# Patient Record
Sex: Male | Born: 1960 | Race: Black or African American | Hispanic: No | Marital: Married | Smoking: Never smoker
Health system: Southern US, Community
[De-identification: ages and names within clinical notes are randomized; demographics above are authoritative.]

## PROBLEM LIST (undated history)

## (undated) DIAGNOSIS — I10 Essential (primary) hypertension: Secondary | ICD-10-CM

## (undated) DIAGNOSIS — K219 Gastro-esophageal reflux disease without esophagitis: Secondary | ICD-10-CM

## (undated) DIAGNOSIS — T884XXA Failed or difficult intubation, initial encounter: Secondary | ICD-10-CM

## (undated) DIAGNOSIS — M1990 Unspecified osteoarthritis, unspecified site: Secondary | ICD-10-CM

## (undated) DIAGNOSIS — T8859XA Other complications of anesthesia, initial encounter: Secondary | ICD-10-CM

## (undated) DIAGNOSIS — T4145XA Adverse effect of unspecified anesthetic, initial encounter: Secondary | ICD-10-CM

## (undated) HISTORY — PX: WRIST SURGERY: SHX841

## (undated) HISTORY — PX: VASECTOMY: SHX75

---

## 2007-06-19 ENCOUNTER — Ambulatory Visit (HOSPITAL_COMMUNITY): Admission: RE | Admit: 2007-06-19 | Discharge: 2007-06-19 | Payer: Self-pay | Admitting: Pulmonary Disease

## 2007-09-21 ENCOUNTER — Emergency Department (HOSPITAL_COMMUNITY): Admission: EM | Admit: 2007-09-21 | Discharge: 2007-09-22 | Payer: Self-pay | Admitting: Emergency Medicine

## 2008-01-07 ENCOUNTER — Inpatient Hospital Stay (HOSPITAL_COMMUNITY): Admission: EM | Admit: 2008-01-07 | Discharge: 2008-01-10 | Payer: Self-pay | Admitting: Emergency Medicine

## 2008-08-12 ENCOUNTER — Emergency Department (HOSPITAL_COMMUNITY): Admission: EM | Admit: 2008-08-12 | Discharge: 2008-08-12 | Payer: Self-pay | Admitting: Emergency Medicine

## 2009-02-08 ENCOUNTER — Emergency Department (HOSPITAL_COMMUNITY): Admission: EM | Admit: 2009-02-08 | Discharge: 2009-02-08 | Payer: Self-pay | Admitting: Family Medicine

## 2009-03-04 ENCOUNTER — Emergency Department (HOSPITAL_COMMUNITY): Admission: EM | Admit: 2009-03-04 | Discharge: 2009-03-04 | Payer: Self-pay | Admitting: Emergency Medicine

## 2010-12-25 NOTE — Discharge Summary (Signed)
[name], [name] NO.:  [id number]   MEDICAL RECORD NO.:  [id number]          PATIENT TYPE:  INP   LOCATION:  4733                         FACILITY:  [facility]   PHYSICIAN:  Mina Marble, M.D.DATE OF BIRTH:  Nov 25, 1960   DATE OF ADMISSION:  01/07/2008  DATE OF DISCHARGE:  01/10/2008                               DISCHARGE SUMMARY   DISCHARGE DIAGNOSES:  1. Urinary tract infection.  2. Bacterial infection of urinary tract with Gram-negative organisms.  3. Calculus stone of kidney, clinically.  4. Hypertension, treated and improved.  5. Hyperlipidemia with hypercholesterolism.  6. Cardiac dysrhythmia with episode of sinus tachycardia.   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  The patient had no known history of kidney stones or  previous urinary tract infection.   FAMILY HISTORY:  Noncontributory.   REVIEW OF SYSTEMS:  Head, eyes, ears, nose and throat, cardiopulmonary,  neuromuscular system, GI system to be unremarkable.   SOCIAL HISTORY:  No history of smoking or alcohol abuse.   ALLERGIES:  No known drug allergies.   Physical examination on Jan 07, 2008, revealed the patient to be a well-  developed, well-nourished black male in no acute distress, but  complaining of some right-sided pain.  The lungs were clear to  auscultation.  The heart had a regular rhythm with mild sinus  tachycardia.  The abdomen revealed mild right flank tenderness.  The  genitalia revealed normal external male genitalia.  The extremities  revealed minimal changes of degenerative joint disease of the knees.   HOSPITAL COURSE:  The patient's hospital  course was one of gradual  improvement.  He received, while in the emergency department, abdominal  and pelvic CT scan which revealed no acute abdominal findings or changes  of defects for pyelonephritis.  The patient did have on urinalysis a  large amount of hemoglobin and too numerous to count rbc's on his urine,  as well as a high number of wbc's and large amount of leukocyte esterase  in his initial urine on Jan 07, 2008.  This did improve during his  hospitalization with the blood and urine and occult studies resolving,  receiving antibiotic therapy, and the leukocyte esterase reduced to  moderate with a few extra wbc's.  The patient received Dilaudid 2 mg IV  for his pain control and Zofran 4 mg IV for control of his nausea.  He  received Cipro 400 mg IV q. 12 hours for his antibiotic, which was  changed to p.o. after his nausea and vomiting improved and his urine  culture revealed a bacteria called Citrobacter koseri that was sensitive  to all antibiotics tested including Cipro.  The patient's lipid panel  revealed his cholesterol was now in the normal range total, but his HDL  was still low at 37 mg percent with an LDL of 70 mg percent on 10 mg of  Crestor p.o. daily.  He was discharged home on Crestor 10 mg  tablets one daily p.o., Coreg 12.5 mg tablets p.o. q. 12 hours, Norvasc  5 mg tablets p.o. daily, Cipro 500 mg tablet p.o. q. 12 hours to  continue for 5 more days and to drink plenty of water, at least 8  glasses daily.  He was to follow up in my office.      Mina Marble, M.D.  Electronically Signed     GRK/MEDQ  D:  02/29/2008  T:  03/01/2008  Job:  [id number]

## 2010-12-29 ENCOUNTER — Inpatient Hospital Stay (INDEPENDENT_AMBULATORY_CARE_PROVIDER_SITE_OTHER)
Admission: RE | Admit: 2010-12-29 | Discharge: 2010-12-29 | Disposition: A | Discharge status: Home / Self Care | Source: Ambulatory Visit | Attending: Emergency Medicine | Admitting: Emergency Medicine

## 2010-12-29 DIAGNOSIS — L255 Unspecified contact dermatitis due to plants, except food: Secondary | ICD-10-CM

## 2011-01-30 ENCOUNTER — Inpatient Hospital Stay (INDEPENDENT_AMBULATORY_CARE_PROVIDER_SITE_OTHER)
Admission: RE | Admit: 2011-01-30 | Discharge: 2011-01-30 | Disposition: A | Discharge status: Home / Self Care | Source: Ambulatory Visit | Attending: Family Medicine | Admitting: Family Medicine

## 2011-01-30 DIAGNOSIS — K120 Recurrent oral aphthae: Secondary | ICD-10-CM

## 2011-03-17 ENCOUNTER — Inpatient Hospital Stay (INDEPENDENT_AMBULATORY_CARE_PROVIDER_SITE_OTHER)
Admission: RE | Admit: 2011-03-17 | Discharge: 2011-03-17 | Disposition: A | Discharge status: Home / Self Care | Source: Ambulatory Visit | Attending: Emergency Medicine | Admitting: Emergency Medicine

## 2011-03-17 DIAGNOSIS — H11159 Pinguecula, unspecified eye: Secondary | ICD-10-CM

## 2011-03-17 DIAGNOSIS — H109 Unspecified conjunctivitis: Secondary | ICD-10-CM

## 2011-03-27 ENCOUNTER — Inpatient Hospital Stay (INDEPENDENT_AMBULATORY_CARE_PROVIDER_SITE_OTHER)
Admission: RE | Admit: 2011-03-27 | Discharge: 2011-03-27 | Disposition: A | Discharge status: Home / Self Care | Source: Ambulatory Visit | Attending: Family Medicine | Admitting: Family Medicine

## 2011-03-27 DIAGNOSIS — M7989 Other specified soft tissue disorders: Secondary | ICD-10-CM

## 2011-04-21 ENCOUNTER — Emergency Department (HOSPITAL_COMMUNITY)

## 2011-04-21 ENCOUNTER — Inpatient Hospital Stay (HOSPITAL_COMMUNITY)
Admission: EM | Admit: 2011-04-21 | Discharge: 2011-04-23 | DRG: 177 | Disposition: A | Discharge status: Home / Self Care | Attending: Internal Medicine | Admitting: Internal Medicine

## 2011-04-21 DIAGNOSIS — Y838 Other surgical procedures as the cause of abnormal reaction of the patient, or of later complication, without mention of misadventure at the time of the procedure: Secondary | ICD-10-CM | POA: Diagnosis present

## 2011-04-21 DIAGNOSIS — R0902 Hypoxemia: Secondary | ICD-10-CM | POA: Diagnosis present

## 2011-04-21 DIAGNOSIS — J9600 Acute respiratory failure, unspecified whether with hypoxia or hypercapnia: Secondary | ICD-10-CM | POA: Diagnosis present

## 2011-04-21 DIAGNOSIS — J690 Pneumonitis due to inhalation of food and vomit: Principal | ICD-10-CM | POA: Diagnosis present

## 2011-04-21 DIAGNOSIS — IMO0002 Reserved for concepts with insufficient information to code with codable children: Secondary | ICD-10-CM | POA: Diagnosis present

## 2011-04-21 DIAGNOSIS — I10 Essential (primary) hypertension: Secondary | ICD-10-CM | POA: Diagnosis present

## 2011-04-21 DIAGNOSIS — R042 Hemoptysis: Secondary | ICD-10-CM | POA: Diagnosis present

## 2011-04-21 DIAGNOSIS — Z79899 Other long term (current) drug therapy: Secondary | ICD-10-CM

## 2011-04-21 DIAGNOSIS — Z87891 Personal history of nicotine dependence: Secondary | ICD-10-CM

## 2011-04-21 DIAGNOSIS — Z23 Encounter for immunization: Secondary | ICD-10-CM

## 2011-04-21 DIAGNOSIS — N200 Calculus of kidney: Secondary | ICD-10-CM | POA: Diagnosis present

## 2011-04-21 DIAGNOSIS — E785 Hyperlipidemia, unspecified: Secondary | ICD-10-CM | POA: Diagnosis present

## 2011-04-21 DIAGNOSIS — Y9210 Unspecified residential institution as the place of occurrence of the external cause: Secondary | ICD-10-CM | POA: Diagnosis present

## 2011-04-21 LAB — CBC
HCT: 42.8 % (ref 39.0–52.0)
Hemoglobin: 14.9 g/dL (ref 13.0–17.0)
MCH: 29.4 pg (ref 26.0–34.0)
MCHC: 34.8 g/dL (ref 30.0–36.0)
MCV: 84.6 fL (ref 78.0–100.0)
Platelets: 158 10*3/uL (ref 150–400)
RBC: 5.06 MIL/uL (ref 4.22–5.81)
RDW: 12.8 % (ref 11.5–15.5)
WBC: 12.8 10*3/uL — ABNORMAL HIGH (ref 4.0–10.5)

## 2011-04-21 LAB — POCT I-STAT 3, ART BLOOD GAS (G3+)
Acid-Base Excess: 2 mmol/L (ref 0.0–2.0)
Bicarbonate: 27 mEq/L — ABNORMAL HIGH (ref 20.0–24.0)
O2 Saturation: 96 %
Patient temperature: 97.5
TCO2: 28 mmol/L (ref 0–100)
pCO2 arterial: 39.7 mmHg (ref 35.0–45.0)
pH, Arterial: 7.438 (ref 7.350–7.450)
pO2, Arterial: 76 mmHg — ABNORMAL LOW (ref 80.0–100.0)

## 2011-04-21 LAB — POCT I-STAT, CHEM 8
BUN: 15 mg/dL (ref 6–23)
Calcium, Ion: 1.06 mmol/L — ABNORMAL LOW (ref 1.12–1.32)
Chloride: 103 mEq/L (ref 96–112)
Creatinine, Ser: 0.9 mg/dL (ref 0.50–1.35)
Glucose, Bld: 140 mg/dL — ABNORMAL HIGH (ref 70–99)
HCT: 46 % (ref 39.0–52.0)
Hemoglobin: 15.6 g/dL (ref 13.0–17.0)
Potassium: 3.7 mEq/L (ref 3.5–5.1)
Sodium: 139 mEq/L (ref 135–145)
TCO2: 25 mmol/L (ref 0–100)

## 2011-04-22 ENCOUNTER — Inpatient Hospital Stay (HOSPITAL_COMMUNITY)

## 2011-04-22 DIAGNOSIS — R0902 Hypoxemia: Secondary | ICD-10-CM

## 2011-04-22 DIAGNOSIS — R042 Hemoptysis: Secondary | ICD-10-CM

## 2011-04-22 LAB — TROPONIN I: Troponin I: <0.3 ng/mL (ref <0.30)

## 2011-04-22 LAB — CBC
HCT: 39.9 % (ref 39.0–52.0)
Hemoglobin: 13.7 g/dL (ref 13.0–17.0)
MCH: 29.3 pg (ref 26.0–34.0)
MCHC: 34.3 g/dL (ref 30.0–36.0)
RBC: 4.68 MIL/uL (ref 4.22–5.81)
WBC: 10.3 10*3/uL (ref 4.0–10.5)

## 2011-04-22 LAB — CK TOTAL AND CKMB (NOT AT ARMC)
CK, MB: 2.2 ng/mL (ref 0.3–4.0)
CK, MB: 2.2 ng/mL (ref 0.3–4.0)
Relative Index: INVALID (ref 0.0–2.5)
Total CK: 99 U/L (ref 7–232)

## 2011-04-22 LAB — DIFFERENTIAL
Basophils Absolute: 0 10*3/uL (ref 0.0–0.1)
Basophils Relative: 0 % (ref 0–1)
Lymphocytes Relative: 12 % (ref 12–46)
Monocytes Absolute: 0.6 10*3/uL (ref 0.1–1.0)
Monocytes Relative: 6 % (ref 3–12)

## 2011-04-22 LAB — COMPREHENSIVE METABOLIC PANEL
ALT: 17 U/L (ref 0–53)
BUN: 14 mg/dL (ref 6–23)
GFR calc Af Amer: >60 mL/min (ref >60)
Glucose, Bld: 98 mg/dL (ref 70–99)
Potassium: 3.6 mEq/L (ref 3.5–5.1)
Total Bilirubin: 1.5 mg/dL — ABNORMAL HIGH (ref 0.3–1.2)

## 2011-04-22 LAB — MRSA PCR SCREENING: MRSA by PCR: NEGATIVE

## 2011-04-22 LAB — GLUCOSE, CAPILLARY: Glucose-Capillary: 162 mg/dL — ABNORMAL HIGH (ref 70–99)

## 2011-04-22 LAB — CARDIAC PANEL(CRET KIN+CKTOT+MB+TROPI)
Relative Index: 1.9 (ref 0.0–2.5)
Troponin I: <0.3 ng/mL (ref <0.30)

## 2011-04-22 LAB — PRO B NATRIURETIC PEPTIDE: Pro B Natriuretic peptide (BNP): 17.8 pg/mL (ref 0–125)

## 2011-04-22 LAB — MAGNESIUM: Magnesium: 2.2 mg/dL (ref 1.5–2.5)

## 2011-04-23 ENCOUNTER — Inpatient Hospital Stay (HOSPITAL_COMMUNITY)

## 2011-04-23 LAB — GLUCOSE, CAPILLARY
Glucose-Capillary: 121 mg/dL — ABNORMAL HIGH (ref 70–99)
Glucose-Capillary: 121 mg/dL — ABNORMAL HIGH (ref 70–99)
Glucose-Capillary: 136 mg/dL — ABNORMAL HIGH (ref 70–99)
Glucose-Capillary: 155 mg/dL — ABNORMAL HIGH (ref 70–99)

## 2011-04-28 LAB — CULTURE, BLOOD (ROUTINE X 2)
Culture  Setup Time: 201209131030
Culture: NO GROWTH

## 2011-04-28 NOTE — Discharge Summary (Signed)
  NAMEMARGUIS, MATHIESON NO.:  [id number]  MEDICAL RECORD NO.:  [id number]  LOCATION:  5530                         FACILITY:  [facility]  PHYSICIAN:  Isidor Holts, M.D.  DATE OF BIRTH:  1960/10/29  DATE OF ADMISSION:  04/21/2011 DATE OF DISCHARGE:  04/23/2011                              DISCHARGE SUMMARY   ADDENDUM  Note: Radiology report from April 23, 2011, obtained and reviewed. It shows near-complete resolution of pneumonia throughout both lungs since April 22, 2011, with only minimal patchy airspace opacity persisting in the right upper lobe.  No new abnormalities.  COMMENTS:  In view of these x-rays, the patient is considered clinically stable for discharge as planned today.  He was therefore discharged accordingly. For followup arrangements and other disposition plans, refer to discharge summary dictated by Algis Downs, PA.     Isidor Holts, M.D.     CO/MEDQ  D:  04/23/2011  T:  04/23/2011  Job:  [id number]  cc:   Candyce Churn. Allyne Gee, M.D.  Electronically Signed by Isidor Holts M.D. on 04/28/2011 03:04:49 PM

## 2011-04-28 NOTE — Discharge Summary (Signed)
[name], [name] NO.:  [id number]  MEDICAL RECORD NO.:  [id number]  LOCATION:  5530                         FACILITY:  [facility]  PHYSICIAN:  Isidor Holts, M.D.  DATE OF BIRTH:  16-Jun-1961  DATE OF ADMISSION:  04/21/2011 DATE OF DISCHARGE:                              DISCHARGE SUMMARY   PRIMARY CARE PHYSICIAN:  [name]. Allyne Gee, MD  DISCHARGE DIAGNOSES: 1. Aspiration pneumonitis. 2. Hemoptysis.  The rest of his discharge diagnoses are consistent with his past medical history and include: 1. Hypertension. 2. Hyperlipidemia. 3. Nephrolithiasis.  CONSULTANTS:  Pulmonary Care Critical Medicine.  HISTORY AND BRIEF HOSPITAL COURSE:  [name] is a pleasant 50 year old male with a history of hypertension and hyperlipidemia, who had surgery for ganglion cyst removal by Dr. Dairl Ponder at approximately 3 in the afternoon on April 21, 2011.  After the patient awakened, he wasfeeling short of breath and coughed up blood.  He was transferred to the emergency department.  The patient had a chest x-ray done which showed diffuse fluffy bilateral airspace opacification within both lungs.  The patient's shortness of breath had improved and he was only on 2 L of oxygen.  Again, he had been intubated for his ganglion cyst surgery.  He was admitted to the [facility] for further evaluation and workup.  At the time of admission, the patient reported no chest pain, but did have some throat pain.  If he took a deep breath, he coughed and would bring up blood mixed with sputum.  Consequently, Critical Care Medicine was consulted for hemoptysis.  The patient was placed on IV antibiotics, specifically clindamycin q.8 h. as well as albuterol nebulizers and incentive spirometry.  He was monitored closely and improved quickly, was transferred to the floor on April 22, 2011. Today, when I enter his room, he sits up quickly.  He appears well and tells  me he is ready for discharge. Follow up chest xray on 04/23/11 shows near complete resolution of bilateral lung infiltrates.  PHYSICAL EXAMINATION:  GENERAL:  The patient is alert and oriented.  He appears well. VITAL SIGNS:  Temperature 99.1, pulse 89, respirations 20, blood pressure 117/76, O2 sats 92% on room air. HEENT:  Head is atraumatic, normocephalic.  Eyes are anicteric with pupils that are equal, round, reactive to light.  Nose shows no nasal discharge or exterior lesions.  Mouth has moist mucous membranes with good dentition. NECK:  Supple with midline trachea.  No JVD.  No lymphadenopathy. CHEST:  No accessory muscle use.  He has no wheezes or crackles to my exam. HEART:  Regular rate and rhythm without obvious murmurs, rubs, or gallops. ABDOMEN:  Soft, nontender, nondistended with good bowel sounds. EXTREMITIES:  No clubbing, cyanosis, or edema. NEUROLOGIC:  The patient is alert and oriented.  His demeanor is pleasant and cooperative.  Cranial nerves II through XII are grossly intact.  He has no facial asymmetries.  No obvious focal neuro deficits.  PERTINENT LABORATORY DATA THIS HOSPITALIZATION:  On April 22, 2011, the patient's CBC was as follows:  WBC 10.3, hemoglobin 13.7, hematocrit 39.9, platelets 169.  CBGs during this admission have ranged from 104- 155.  BMET shows a sodium of 139, potassium 3.6, chloride 101, bicarb 26, glucose 98, BUN 14, creatinine 0.85.  LFTs show a total bilirubin of 1.5, alkaline phosphatase 55, AST 15, ALT 17, total protein 6.4. Cardiac enzymes x2 were cycled and found to be within normal limits. BNP was 17.8.  Blood cultures were drawn, they are still in the preliminary status, but show no growth to date.  RADIOLOGICAL EXAMINATIONS:  The patient had a two-view x-ray of his chest on April 21, 2011, that showed diffuse fluffy bilateral airspace opacification in both lungs with partial peripheral spearing. He had a follow-up chest  x-ray yesterday, April 22, 2011, that showed little change to slight increase in patchy airspace disease at the tip of the right midlung. Chest xray April 23, 2011 shows near complete resolution of bilateral lung infiltrates.  DISCHARGE INSTRUCTIONS: 1. Increase activity slowly. 2. Heart-healthy diet. 3. See Dr. Dorothyann Peng on April 28, 2011, at 3:30 p.m.  The patient's discharge medications are as follows: 1. Clindamycin 600 mg 1 tablet p.o. b.i.d. for 7 days. 2. Amlodipine 10 mg half a tablet by mouth daily. 3. Carvedilol 25 mg half a tablet by mouth twice daily. 4. Methocarbamol 500 mg 1 tablet by mouth 3 times a day as needed for     muscle spasms. 5. Multivitamin over the counter 1 tablet daily. 6. Rosuvastatin 20 mg half a tablet by mouth daily.  Suggestions for outpatient follow up: 1. The patient's blood glucose levels ranged between 100 and 160.  Please consider Hgb A1c to evaluate for pre-diabetes.     Stephani Police, PA   ______________________________ Isidor Holts, M.D.    MLY/MEDQ  D:  04/23/2011  T:  04/23/2011  Job:  [id number]  cc:   Candyce Churn. Sherrill Raring.D.Electronically Signed by Algis Downs PA on 04/23/2011 12:25:33 PM Electronically Signed by Isidor Holts M.D. on 04/28/2011 03:05:00 PM

## 2011-05-05 LAB — DIFFERENTIAL
Basophils Absolute: 0
Eosinophils Relative: 0
Lymphocytes Relative: 4 — ABNORMAL LOW
Lymphs Abs: 0.5 — ABNORMAL LOW
Neutro Abs: 12.9 — ABNORMAL HIGH
Neutrophils Relative %: 91 — ABNORMAL HIGH

## 2011-05-05 LAB — CULTURE, BLOOD (ROUTINE X 2): Culture: NO GROWTH

## 2011-05-05 LAB — URINE CULTURE: Colony Count: >=100000

## 2011-05-05 LAB — URINALYSIS, ROUTINE W REFLEX MICROSCOPIC
Ketones, ur: 15 — AB
Nitrite: NEGATIVE
Protein, ur: 30 — AB

## 2011-05-05 LAB — COMPREHENSIVE METABOLIC PANEL
AST: 28
BUN: 7
CO2: 25
Calcium: 9
Creatinine, Ser: 0.92
GFR calc Af Amer: >60
GFR calc non Af Amer: >60

## 2011-05-05 LAB — PROTIME-INR
INR: 1
Prothrombin Time: 13.3

## 2011-05-05 LAB — CBC
MCV: 88
Platelets: 174
RDW: 12.8
WBC: 14.1 — ABNORMAL HIGH

## 2011-05-05 LAB — APTT: aPTT: 30

## 2011-05-05 LAB — OCCULT BLOOD X 1 CARD TO LAB, STOOL: Fecal Occult Bld: NEGATIVE

## 2011-05-05 LAB — CK TOTAL AND CKMB (NOT AT ARMC): Total CK: 245 — ABNORMAL HIGH

## 2011-05-05 LAB — TROPONIN I: Troponin I: 0.03

## 2011-05-06 LAB — COMPREHENSIVE METABOLIC PANEL
AST: 22
CO2: 28
Calcium: 8.5
Creatinine, Ser: 1.03
GFR calc Af Amer: >60
GFR calc non Af Amer: >60
Sodium: 142
Total Protein: 5.7 — ABNORMAL LOW

## 2011-05-06 LAB — DIFFERENTIAL
Eosinophils Relative: 0
Lymphocytes Relative: 6 — ABNORMAL LOW
Lymphs Abs: 1.3
Monocytes Relative: 8
Neutrophils Relative %: 85 — ABNORMAL HIGH

## 2011-05-06 LAB — BASIC METABOLIC PANEL
CO2: 26
Calcium: 8.8
Chloride: 109
GFR calc Af Amer: >60
Potassium: 3.9
Sodium: 141

## 2011-05-06 LAB — FREE PSA
PSA, Free Pct: 25 (ref >25)
PSA, Free: 3

## 2011-05-06 LAB — URINALYSIS, ROUTINE W REFLEX MICROSCOPIC
Bilirubin Urine: NEGATIVE
Glucose, UA: NEGATIVE
Hgb urine dipstick: NEGATIVE
Ketones, ur: NEGATIVE
Protein, ur: NEGATIVE
Protein, ur: NEGATIVE
Urobilinogen, UA: 0.2

## 2011-05-06 LAB — CBC
HCT: 40.1
Hemoglobin: 13.9
MCHC: 34.7
MCHC: 35.3
MCV: 87.4
RBC: 4.46
RBC: 4.55
RDW: 13.2

## 2011-05-06 LAB — LIPID PANEL
HDL: 37 — ABNORMAL LOW
LDL Cholesterol: 70
Total CHOL/HDL Ratio: 3.3
Triglycerides: 77
VLDL: 15

## 2011-05-06 LAB — SEDIMENTATION RATE: Sed Rate: 11

## 2011-05-06 LAB — T4, FREE: Free T4: 1.07

## 2011-05-06 LAB — URINE CULTURE: Culture: NO GROWTH

## 2011-05-06 LAB — URINE MICROSCOPIC-ADD ON

## 2011-05-06 LAB — TSH: TSH: 2.65

## 2011-05-09 NOTE — H&P (Signed)
NAMECAYNE, [name] NO.:  [id number]  MEDICAL RECORD NO.:  [id number]  LOCATION:  [facility]                         FACILITY:  [facility]  PHYSICIAN:  Eduard Clos, MDDATE OF BIRTH:  March 06, 1961  DATE OF ADMISSION:  04/21/2011 DATE OF DISCHARGE:                             HISTORY & PHYSICAL   PRIMARY CARE PHYSICIAN:  [name]. Allyne Gee, MD  ORTHOPEDIC SURGEON:  Artist Pais. Mina Marble, MD  CHIEF COMPLAINT:  Shortness of breath and coughing up blood.  HISTORY OF PRESENT ILLNESS:  This is a 50 year old male with history of hypertension and hyperlipidemia, had a surgery today earlier around 3 p.m. when he had ganglion cyst removed from his right wrist.  At that time, the patient had a general anesthesia and was intubated.  After the surgery, the patient was feeling short of breath and is coughing up blood and initially was 100% non-rebreather and was transferred to the ER.  In the ER, the patient had chest x-ray done, which shows at this time diffuse fluffy bilateral airspace opacification within both lungs. The patient's shortness of breath improved at this time and is only on 2 liters of oxygen.  The patient is admitted for further observation.  The patient states he has no chest pain, but has some throat pain since the surgery.  If he takes a deep breath, he gets cough; and when he coughs he brings up blood with sputum.  Denies any dizziness, loss of consciousness, or any focal deficit.  Denies any headache or visual symptoms.  Denies any nausea, vomiting, abdominal pain, dysuria, discharge, or diarrhea.  MEDICAL HISTORY:  History of hypertension and hyperlipidemia.  PAST SURGICAL HISTORY:  Left ganglion cyst removal.  Today, he had a right ganglion cyst removal from his right wrist.  MEDICATIONS PRIOR TO ADMISSION:  The patient is on Crestor and amlodipine, doses have to be verified.  ALLERGIES:  No known drug allergies.  FAMILY HISTORY:  Nothing  contributory.  SOCIAL HISTORY:  The patient quit smoking 25 years ago.  Denies any alcohol or drug abuse.  REVIEW OF SYSTEMS:  As per history of present illness, nothing else significant.  PHYSICAL EXAMINATION:  GENERAL:  The patient is examined at bedside, not in acute distress. VITAL SIGNS:  Blood pressure 130/80, pulse 87 per minute, temperature 97.5, respirations 18 per minute, and O2 sat 99%. HEENT:  Anicteric.  No pallor.  No discharge from ears, eyes, nose, or mouth. CHEST:  Bilateral entry present.  No rhonchi.  No crepitation. HEART:  S1 and S2 heard. ABDOMEN:  Soft and nontender.  Bowel sounds heard. CENTRAL NERVOUS SYSTEM:  Alert, awake, and oriented to time, place, and person.  Moves upper and lower extremities 5/5. EXTREMITIES:  Peripheral pulses felt.  No edema.  LABORATORY DATA:  EKG has been ordered.  Chest x-ray shows diffuse fluffy bilateral airspace opacification within the both lungs with parietal peripheral sparing.  This may reflect significant multifocal pneumoniae and hemoptysis, pulmonary as well as proteinosis could have a similar appearance, but it is much less common.  CBC:  WBC is 12.8, hemoglobin 15.6, hematocrit is 46, and platelets 158.  Basic metabolic panel:  Sodium 139, potassium  3.7, chloride 103, bicarb was 25, glucose 140, BUN 15, and creatinine 0.9.  ASSESSMENT: 1. Pneumonitis. 2. Status post surgery for right wrist ganglion cyst today. 3. History of hypertension. 4. History of hyperlipidemia.  PLAN: 1. At this time, we will admit the patient to Step-Down Unit. 2. For his pneumonitis, at this time it could be aspiration, but given     his hemoptysis it is concerning if he had any tracheal tear or     other injuries during intubation.  We will keep n.p.o. for now,     place him on clindamycin for possible aspiration.  We will place     the patient also on albuterol.  I will consult Pulmonary.  At the     same time, I will also check  cardiac enzymes and BNP and further     recommendation based on test ordered, clinical course, and consult     recommendations.     Eduard Clos, MD     ANK/MEDQ  D:  04/21/2011  T:  04/21/2011  Job:  [id number]  cc:   Candyce Churn. Allyne Gee, M.D. Artist Pais Mina Marble, M.D.  Electronically Signed by Midge Minium MD on 05/09/2011 11:57:04 AM

## 2012-03-16 ENCOUNTER — Emergency Department (HOSPITAL_BASED_OUTPATIENT_CLINIC_OR_DEPARTMENT_OTHER)
Admission: RE | Admit: 2012-03-16 | Disposition: A | Discharge status: Home / Self Care | Payer: Self-pay | Source: Emergency Department | Attending: Emergency Medicine | Admitting: Emergency Medicine

## 2012-03-16 ENCOUNTER — Encounter (HOSPITAL_BASED_OUTPATIENT_CLINIC_OR_DEPARTMENT_OTHER): Payer: Self-pay | Admitting: Emergency Medicine

## 2012-03-16 HISTORY — DX: Essential (primary) hypertension: I10

## 2012-03-16 MED ORDER — FAMOTIDINE 40 MG PO TABS
40.00 mg | ORAL_TABLET | Freq: Every day | ORAL | Status: AC
Start: 2012-03-16 — End: 2012-03-21

## 2012-03-16 MED ORDER — FAMOTIDINE 20 MG PO TABS
20.00 mg | ORAL_TABLET | Freq: Once | ORAL | Status: AC
Start: 2012-03-16 — End: 2012-03-16
  Administered 2012-03-16: 20 mg via ORAL
  Filled 2012-03-16: qty 1

## 2012-03-16 MED ORDER — DIPHENHYDRAMINE HCL 25 MG PO CAPS
25.00 mg | ORAL_CAPSULE | Freq: Four times a day (QID) | ORAL | Status: AC | PRN
Start: 2012-03-16 — End: 2012-04-15

## 2012-03-16 MED ORDER — PREDNISONE 50 MG PO TABS
60.00 mg | ORAL_TABLET | Freq: Once | ORAL | Status: AC
Start: 2012-03-16 — End: 2012-03-16
  Administered 2012-03-16: 60 mg via ORAL
  Filled 2012-03-16: qty 1

## 2012-03-16 MED ORDER — PREDNISONE 50 MG PO TABS
50.00 mg | ORAL_TABLET | Freq: Every day | ORAL | Status: AC
Start: 2012-03-16 — End: 2012-03-21

## 2012-03-16 NOTE — Discharge Instructions (Signed)
Allergic Reaction, Mild to Moderate  Allergies may happen from anything your body is sensitive to. This may be food, medications, pollens, chemicals, and nearly anything around you in everyday life that produces allergens. An allergen is anything that causes an allergy producing substance. Allergens cause your body to release allergic antibodies. Through a chain of events, they cause a release of histamine into the blood stream. Histamines are meant to protect you, but they also cause your discomfort. This is why anti-histamines are often used for allergies. Heredity is often a factor in causing allergic reactions. This means you may have some of the same allergies as your parents.  Allergies happen in all age groups. You may have some idea of what caused your reaction. There are many allergens around us. It may be difficult to know what caused your reaction. If this is a first time event, it may never happen again. Allergies cannot be cured but can be controlled with medications.  SYMPTOMS   You may get some or all of the following problems from allergies.   · Swelling and itching in and around the mouth.  · Tearing, itchy eyes.   · Nasal congestion and runny nose.   · Sneezing and coughing.  · An itchy red rash or hives.   · Vomiting or diarrhea.   · Difficulty breathing.    Seasonal allergies occur in all age groups. They are seasonal because they usually occur during the same season every year. They may be a reaction to molds, grass pollens, or tree pollens. Other causes of allergies are house dust mite allergens, pet dander and mold spores. These are just a common few of the thousands of allergens around us. All of the symptoms listed above happen when you come in contact with pollens and other allergens. Seasonal allergies are usually not life threatening. They are generally more of a nuisance that can often be handled using medications.  Hay fever is a combination of all or some of the above listed allergy  problems. It may often be treated with simple over-the-counter medications such as diphenhydramine. Take medication as directed. Check with your caregiver or package insert for child dosages.  TREATMENT AND HOME CARE INSTRUCTIONS  If hives or rash are present:  · Take medications as directed.   · You may use an over-the-counter antihistamine (diphenhydramine) for hives and itching as needed. Do not drive or drink alcohol until medications used to treat the reaction have worn off. Antihistamines tend to make people sleepy.   · Apply cold cloths (compresses) to the skin or take baths in cool water. This will help itching. Avoid hot baths or showers. Heat will make a rash and itching worse.   · If your allergies persist and become more severe, and over the counter medications are not effective, there are many new medications your caretaker can prescribe. Immunotherapy or desensitizing injections can be used if all else fails. Follow up with your caregiver if problems continue.   SEEK MEDICAL CARE IF:  · Your allergies are becoming progressively more troublesome.   · You suspect a food allergy. Symptoms generally happen within 30 minutes of eating a food.   · Your symptoms have not gone away within 2 days or are getting worse.   · You develop new symptoms.   · You want to retest yourself or your child with a food or drink you think causes an allergic reaction. Never test yourself or your child of a suspected allergy without being   under the watchful eye of your caregivers. A second exposure to an allergen may be life threatening.   SEEK IMMEDIATE MEDICAL CARE IF YOU DEVELOP:  · Difficulty breathing or wheezing, or have a tight feeling in your chest or throat.   · A swollen mouth, hives, swelling, or itching all over your body.   · A severe reaction with any of the above problems should be considered life threatening.   If you suddenly develop difficulty breathing call for local emergency medical help. THIS IS AN  EMERGENCY.  MAKE SURE YOU:   · Understand these instructions.   · Will watch your condition.   · Will get help right away if you are not doing well or get worse.   Document Released: 05/23/2007 Document Re-Released: 08/17/2009  [employer]® Patient Information ©2012 [employer].

## 2012-03-16 NOTE — ED Provider Notes (Signed)
eMERGENCY dEPARTMENT eNCOUnter    CHIEF COMPLAINT    Patient presents with:    RASH - RASH      HPI    Jermaine Kirk is a 51 year old male who presents with a waxing and waning rash diffusely applied to his body.  The rash is associated with pruritus.  It is not associated with pain or fever.  Patient does not know of any new exposures.  He denies any new drugs, detergents, soaps, or other new agents.  The patient denies any tick exposures.  He states that there are no lesions in his mouth or around his genitalia.  03/16/2012  6:00 AM.    PAST MEDICAL HISTORY      Past Medical History    HTN (hypertension)        SURGICAL HISTORY    History reviewed. No pertinent past surgical history.    CURRENT MEDICATIONS    1. AMLODIPINE BESYLATE PO  Route: Oral NWG:NFAO  by mouth.  Dispense:  Refill:     2. ATORVASTATIN CALCIUM PO  Route: Oral ZHY:QMVH  by mouth.  Dispense:  Refill:     3. diphenhydrAMINE (BENADRYL) 25 MG capsule  Route: Oral QIO:NGEX 1 capsule by mouth every 6 (six) hours as needed for Itching.  Dispense: 15 capsule Refill: 0    4. famotidine (PEPCID) 40 MG tablet  Route: Oral BMW:UXLK 1 tablet by mouth daily.  Dispense: 5 tablet Refill: 0    5. predniSONE (DELTASONE) 50 MG tablet  Route: Oral GMW:NUUV 1 tablet by mouth daily. for 5 days.  Dispense: 5 tablet Refill: 0      ALLERGIES    Review of Patient's Allergies indicates:  No Known Allergies    FAMILY HISTORY    History reviewed. No pertinent family history.    SOCIAL HISTORY    Social History Main Topics    Smoking Status: Never Smoker                      Alcohol Use: No              Drug Use: No                REVIEW OF SYSTEMS    All other systems negative except as marked.     PHYSICAL EXAM      Vital Signs: BP 141/84  Pulse 81  Temp(Src) 97.4 F  Resp 18  Wt 79.833 kg (176 lb)  SpO2 97%     Constitutional:  Well appearing in no acute distress    HENT:  Normocephalic, Atraumatic, no oral lesions.    Neck: Normal range of motion.      Musculoskeletal:  Good range of motion in all joints    Skin: Diffuse urticarial eruption.    Neurological:  No focal deficits noted.   Moving all extremities.         PROCEDURES  None      ED COURSE & MEDICAL DECISION MAKING    Pertinent Labs & Imaging studies reviewed.   This is a 51 year old male who presents with a diffuse urticarial eruption.  He is not have any oral mucosal involvement.  He does not have any genital involvement.  The rash is itchy.  The patient has not taken anything for relief.    Patient was treated with Pepcid and prednisone in the emergency department.  Because he had to drive, Benadryl was withheld.  Patient was given a prescription  for prednisone, Benadryl, and Pepcid.    Mild allergy instructions were provided.  Reasons to return to the emergency department were reviewed in detail.  [employer] referral line was recommended.    CONDITION:  Improved    DISPOSITION  Home    FINAL IMPRESSION  Urticaria      FOLLOW UP PLAN:  Primary care as needed via [employer] referral line        Marquita Palms, MD  Emergency Room Attending Physician  [facility]  Associate Director of Medical Toxicology  Instructor in Medicine  [facility]

## 2012-03-16 NOTE — ED Triage Note (Signed)
Patient presents to ED with urticarial rash over arms legs and right side of torso onset Friday   " I think its poison ivy I have had it before"   No respiratory distress

## 2012-03-16 NOTE — ED Notes (Signed)
Patient Disposition    Patient education for diagnosis, medications, activity, diet and follow-up.  Patient left ED 6:37 AM.  Patient rep received written instructions.  Interpreter to provide instructions: No    Discharged to: Discharged to home

## 2012-03-31 ENCOUNTER — Encounter (HOSPITAL_COMMUNITY): Payer: Self-pay

## 2012-03-31 ENCOUNTER — Emergency Department (HOSPITAL_COMMUNITY)
Admission: EM | Admit: 2012-03-31 | Discharge: 2012-03-31 | Disposition: A | Discharge status: Home / Self Care | Source: Home / Self Care | Attending: Family Medicine | Admitting: Family Medicine

## 2012-03-31 DIAGNOSIS — L508 Other urticaria: Secondary | ICD-10-CM

## 2012-03-31 HISTORY — DX: Essential (primary) hypertension: I10

## 2012-03-31 MED ORDER — METHYLPREDNISOLONE ACETATE 80 MG/ML IJ SUSP
INTRAMUSCULAR | Status: AC
  Filled 2012-03-31: qty 1

## 2012-03-31 MED ORDER — HYDROXYZINE HCL 25 MG PO TABS: 25.0000 mg | ORAL_TABLET | Freq: Four times a day (QID) | ORAL | Status: AC

## 2012-03-31 MED ORDER — METHYLPREDNISOLONE ACETATE 40 MG/ML IJ SUSP
80.0000 mg | Freq: Once | INTRAMUSCULAR | Status: AC
  Administered 2012-03-31: 80 mg via INTRAMUSCULAR

## 2012-03-31 NOTE — ED Provider Notes (Signed)
History     CSN: [id number]  Arrival date & time 03/31/12  1112   First MD Initiated Contact with Patient 03/31/12 1114      Chief Complaint  Patient presents with  . Rash    (Consider location/radiation/quality/duration/timing/severity/associated sxs/prior treatment) Patient is a 51 y.o. male presenting with rash. The history is provided by the patient.  Rash  This is a recurrent problem. The current episode started more than 1 week ago (3 wks ago, ). The problem has been gradually improving (improving initially but relapsed.). The problem is associated with an unknown factor. There has been no fever. The rash is present on the right upper leg, left upper leg and left arm. The pain is mild. Associated symptoms include itching and pain.    Past Medical History  Diagnosis Date  . Hypertension     Past Surgical History  Procedure Date  . Wrist surgery     No family history on file.  History  Substance Use Topics  . Smoking status: Never Smoker   . Smokeless tobacco: Not on file  . Alcohol Use: No      Review of Systems  Constitutional: Negative.   Skin: Positive for itching and rash.    Allergies  Review of patient's allergies indicates no known allergies.  Home Medications   Current Outpatient Rx  Name Route Sig Dispense Refill  . AMLODIPINE BESYLATE 2.5 MG PO TABS Oral Take 2.5 mg by mouth daily.    Marland Kitchen CARVEDILOL 12.5 MG PO TABS Oral Take 12.5 mg by mouth 2 (two) times daily with a meal.    . SIMVASTATIN 40 MG PO TABS Oral Take 40 mg by mouth at bedtime.    Marland Kitchen HYDROXYZINE HCL 25 MG PO TABS Oral Take 1 tablet (25 mg total) by mouth every 6 (six) hours. 60 tablet 1    BP 131/68  Pulse 72  Temp 98.7 F (37.1 C) (Oral)  Resp 16  SpO2 98%  Physical Exam  Nursing note and vitals reviewed. Constitutional: He is oriented to person, place, and time. He appears well-developed and well-nourished.  HENT:  Mouth/Throat: Oropharynx is clear and moist.    Pulmonary/Chest: Breath sounds normal.  Neurological: He is alert and oriented to person, place, and time.  Skin: Skin is warm and dry. Rash noted.       whelps    ED Course  Procedures (including critical care time)  Labs Reviewed - No data to display No results found.   1. Urticaria, acute       MDM          Linna Hoff, MD 03/31/12 1200

## 2012-03-31 NOTE — ED Notes (Signed)
C/o whelp like rash to arms and legs intermittently for 3 weeks.  States areas swell, itch and are painful.  Reports completing 5 day course of prednisone  last Friday which helped but on Tuesday the rash reappeared.

## 2012-07-10 ENCOUNTER — Encounter (HOSPITAL_COMMUNITY): Payer: Self-pay | Admitting: *Deleted

## 2012-07-10 ENCOUNTER — Emergency Department (HOSPITAL_COMMUNITY)

## 2012-07-10 ENCOUNTER — Emergency Department (HOSPITAL_COMMUNITY)
Admission: EM | Admit: 2012-07-10 | Discharge: 2012-07-10 | Disposition: A | Discharge status: Home / Self Care | Attending: Emergency Medicine | Admitting: Emergency Medicine

## 2012-07-10 DIAGNOSIS — I10 Essential (primary) hypertension: Secondary | ICD-10-CM | POA: Insufficient documentation

## 2012-07-10 DIAGNOSIS — R61 Generalized hyperhidrosis: Secondary | ICD-10-CM | POA: Insufficient documentation

## 2012-07-10 DIAGNOSIS — R5383 Other fatigue: Secondary | ICD-10-CM | POA: Insufficient documentation

## 2012-07-10 DIAGNOSIS — R630 Anorexia: Secondary | ICD-10-CM | POA: Insufficient documentation

## 2012-07-10 DIAGNOSIS — Z79899 Other long term (current) drug therapy: Secondary | ICD-10-CM | POA: Insufficient documentation

## 2012-07-10 DIAGNOSIS — R52 Pain, unspecified: Secondary | ICD-10-CM | POA: Insufficient documentation

## 2012-07-10 DIAGNOSIS — R5381 Other malaise: Secondary | ICD-10-CM | POA: Insufficient documentation

## 2012-07-10 DIAGNOSIS — Z8679 Personal history of other diseases of the circulatory system: Secondary | ICD-10-CM

## 2012-07-10 DIAGNOSIS — R05 Cough: Secondary | ICD-10-CM | POA: Insufficient documentation

## 2012-07-10 DIAGNOSIS — B349 Viral infection, unspecified: Secondary | ICD-10-CM

## 2012-07-10 DIAGNOSIS — R059 Cough, unspecified: Secondary | ICD-10-CM

## 2012-07-10 DIAGNOSIS — R6889 Other general symptoms and signs: Secondary | ICD-10-CM

## 2012-07-10 DIAGNOSIS — B9789 Other viral agents as the cause of diseases classified elsewhere: Secondary | ICD-10-CM | POA: Insufficient documentation

## 2012-07-10 MED ORDER — HYDROCODONE-HOMATROPINE 5-1.5 MG/5ML PO SYRP: 5.0000 mL | ORAL_SOLUTION | Freq: Four times a day (QID) | ORAL | Status: DC | PRN

## 2012-07-10 MED ORDER — ACETAMINOPHEN 325 MG PO TABS
975.0000 mg | ORAL_TABLET | Freq: Once | ORAL | Status: AC
  Administered 2012-07-10: 975 mg via ORAL
  Filled 2012-07-10: qty 3

## 2012-07-10 NOTE — ED Notes (Signed)
Pt states for the past 10 days has been having flu like symptoms, body aches, sinus congestion, sneezing, fever. Pt states laid in bed x 2 days last week then went out of state for thanksgiving and it got worse. Pt denies n/v, states has had diarrhea.

## 2012-07-10 NOTE — ED Provider Notes (Signed)
Medical screening examination/treatment/procedure(s) were performed by non-physician practitioner and as supervising physician I was immediately available for consultation/collaboration  Derwood Kaplan, MD 07/10/12 [phone]

## 2012-07-10 NOTE — ED Provider Notes (Signed)
History     CSN: [id number]  Arrival date & time 07/10/12  1238   First MD Initiated Contact with Patient 07/10/12 1402      No chief complaint on file.   (Consider location/radiation/quality/duration/timing/severity/associated sxs/prior treatment) HPI Comments: [name] 51 y.o. male   The chief complaint is: No chief complaint on file.    51 year old male presents with chief complaint of "flulike symptoms."  Patient states that he has had 10 days of cough, chills, subjective fever, body aches, sweats.  Patient wife has had similar symptoms.  He was in [place] over the weekend before Thanksgiving he states that all of the family was ill with this.  Patient states that he had worsening symptoms he before yesterday and was unable to drive home.  He's been using Alka-Seltzer plus which helped relieve his subjective fevers and aches.  Yesterday he had 2 episodes of watery diarrhea.  He has had decreased appetite fatigue and malaise.  He denies hematochezia.  He has had some nausea but no vomiting. Patient's cough has been present productive of clear phlegm, over the past 2 days he has noticed a change in color to green and brown.  He has associated sore throat but no difficulty breathing, no chest pain, no shortness of breath.  Recent states that he is feeling somewhat better today.  He does have a history of hypertension and did not take his medications this morning. Denies fevers, chills, myalgias, arthralgias. Denies DOE, SOB, chest tightness or pressure, radiation to left arm, jaw or back, or diaphoresis. Denies dysuria, flank pain, suprapubic pain, frequency, urgency, or hematuria. Denies headaches, light headedness, weakness, visual disturbances. Denies abdominal pain, nausea, vomiting, diarrhea or constipation.    The history is provided by the patient.    Past Medical History  Diagnosis Date  . Hypertension     Past Surgical History  Procedure Date  . Wrist surgery      No family history on file.  History  Substance Use Topics  . Smoking status: Never Smoker   . Smokeless tobacco: Never Used  . Alcohol Use: No      Review of Systems Ten systems are reviewed and are negative for acute change except as noted in the HPI  Allergies  Review of patient's allergies indicates no known allergies.  Home Medications   Current Outpatient Rx  Name  Route  Sig  Dispense  Refill  . AMLODIPINE BESYLATE 2.5 MG PO TABS   Oral   Take 2.5 mg by mouth daily.         . ASPIRIN EFFERVESCENT 325 MG PO TBEF   Oral   Take 325 mg by mouth every 6 (six) hours as needed. Cold symptoms         . CARVEDILOL 12.5 MG PO TABS   Oral   Take 12.5 mg by mouth 2 (two) times daily with a meal.         . SIMVASTATIN 40 MG PO TABS   Oral   Take 40 mg by mouth at bedtime.           BP 143/98  Pulse 100  Temp 98.9 F (37.2 C) (Oral)  Resp 18  SpO2 97%  Physical Exam  Nursing note and vitals reviewed. Constitutional: He is oriented to person, place, and time. He appears well-developed and well-nourished. No distress.       Appears well.  HENT:  Head: Normocephalic and atraumatic.  Mouth/Throat: Oropharynx is clear and moist.  TMs normal BL  Eyes: Conjunctivae normal and EOM are normal. Pupils are equal, round, and reactive to light. No scleral icterus.  Neck: Normal range of motion. Neck supple.  Cardiovascular: Normal rate, regular rhythm and normal heart sounds.   Pulmonary/Chest: Effort normal and breath sounds normal. No respiratory distress.       Cough. No rales, ronchi or wheezing  Abdominal: Soft. There is no tenderness.  Musculoskeletal: Normal range of motion. He exhibits no edema.  Lymphadenopathy:    He has no cervical adenopathy.  Neurological: He is alert and oriented to person, place, and time.  Skin: Skin is warm and dry. He is not diaphoretic.  Psychiatric: His behavior is normal.     ED Course  Procedures (including  critical care time)  Labs Reviewed - No data to display No results found.   No diagnosis found.    MDM  BP 143/98  Pulse 100  Temp 98.9 F (37.2 C) (Oral)  Resp 18  SpO2 97% Patient is hypertensive but did not take his medication. He is borderline tachycardic, but without which I suspect is due to dehydration.  He is without fever. Patient with possible flu. Sxs resolving. I have given report to Dahlia Client Muthersbaugh who will dispostition the patient.       Arthor Captain, PA-C 07/10/12 1600

## 2012-07-10 NOTE — ED Provider Notes (Signed)
[name] is a 51 y.o. male with 10 days of cough, chills, subjective fever, body aches, sweats.  Pt care assumed from [place], [place].  Pt hypertensive at triage, but has not taken his home BP medication today.  Plan: CXR pending, if negative home with symptomatic treatment including hycodan and possibly Augmentin.  Pt also needs a work note.    CXR without evidence of edema, effusion or infiltrate.  Discussed these findings with the patient.  Discussed antibiotic nonuse for viral syndromes.  We'll discharge home with symptomatic treatment and without antibiotics. Patient states understanding and is amenable to plan.  1. Medications: Hycodan for cough, usual home medications  2. Treatment: rest, drink plenty of fluids, take medications as prescribed  3. Follow Up: Please followup with your primary doctor for discussion of your diagnoses and further evaluation after today's visit; if you do not have a primary care doctor use the resource guide provided to find one   Dierdre Forth, PA-C 07/10/12 1603

## 2012-07-12 NOTE — ED Provider Notes (Signed)
Medical screening examination/treatment/procedure(s) were performed by non-physician practitioner and as supervising physician I was immediately available for consultation/collaboration.   Joya Gaskins, MD 07/12/12 [phone]

## 2012-07-13 ENCOUNTER — Emergency Department (HOSPITAL_COMMUNITY)

## 2012-07-13 ENCOUNTER — Encounter (HOSPITAL_COMMUNITY): Payer: Self-pay | Admitting: *Deleted

## 2012-07-13 ENCOUNTER — Emergency Department (HOSPITAL_COMMUNITY)
Admission: EM | Admit: 2012-07-13 | Discharge: 2012-07-13 | Disposition: A | Discharge status: Home / Self Care | Attending: Emergency Medicine | Admitting: Emergency Medicine

## 2012-07-13 DIAGNOSIS — J3489 Other specified disorders of nose and nasal sinuses: Secondary | ICD-10-CM | POA: Insufficient documentation

## 2012-07-13 DIAGNOSIS — R059 Cough, unspecified: Secondary | ICD-10-CM | POA: Insufficient documentation

## 2012-07-13 DIAGNOSIS — J40 Bronchitis, not specified as acute or chronic: Secondary | ICD-10-CM | POA: Insufficient documentation

## 2012-07-13 DIAGNOSIS — I10 Essential (primary) hypertension: Secondary | ICD-10-CM | POA: Insufficient documentation

## 2012-07-13 DIAGNOSIS — Z7982 Long term (current) use of aspirin: Secondary | ICD-10-CM | POA: Insufficient documentation

## 2012-07-13 DIAGNOSIS — Z79899 Other long term (current) drug therapy: Secondary | ICD-10-CM | POA: Insufficient documentation

## 2012-07-13 DIAGNOSIS — R05 Cough: Secondary | ICD-10-CM | POA: Insufficient documentation

## 2012-07-13 MED ORDER — ONDANSETRON HCL 8 MG PO TABS: 8.0000 mg | ORAL_TABLET | Freq: Three times a day (TID) | ORAL | Status: DC | PRN

## 2012-07-13 MED ORDER — ALBUTEROL SULFATE HFA 108 (90 BASE) MCG/ACT IN AERS
2.0000 | INHALATION_SPRAY | Freq: Once | RESPIRATORY_TRACT | Status: AC
  Administered 2012-07-13: 2 via RESPIRATORY_TRACT
  Filled 2012-07-13: qty 6.7

## 2012-07-13 MED ORDER — ONDANSETRON 4 MG PO TBDP
8.0000 mg | ORAL_TABLET | Freq: Once | ORAL | Status: AC
  Administered 2012-07-13: 8 mg via ORAL
  Filled 2012-07-13: qty 2

## 2012-07-13 MED ORDER — ALBUTEROL SULFATE (5 MG/ML) 0.5% IN NEBU
5.0000 mg | INHALATION_SOLUTION | Freq: Once | RESPIRATORY_TRACT | Status: AC
  Administered 2012-07-13: 5 mg via RESPIRATORY_TRACT
  Filled 2012-07-13: qty 1

## 2012-07-13 MED ORDER — IPRATROPIUM BROMIDE 0.02 % IN SOLN
0.5000 mg | Freq: Once | RESPIRATORY_TRACT | Status: AC
  Administered 2012-07-13: 0.5 mg via RESPIRATORY_TRACT
  Filled 2012-07-13: qty 2.5

## 2012-07-13 MED ORDER — POLYETHYLENE GLYCOL 3350 17 G PO PACK: 17.0000 g | PACK | Freq: Every day | ORAL | Status: DC

## 2012-07-13 NOTE — ED Provider Notes (Signed)
History     CSN: [id number]  Arrival date & time 07/13/12  0250   First MD Initiated Contact with Patient 07/13/12 0257      Chief Complaint  Patient presents with  . Nausea  . Emesis    (Consider location/radiation/quality/duration/timing/severity/associated sxs/prior treatment) HPI History provided by pt and prior chart.  Per prior chart, pt presented to ED on 07/10/12 w/ c/o several days of cough.  CXR obtained and neg for pneumonia, edema, effusion, ect and pt d/c'd home w/ hycodan.  Returns to ED today because his cough has worsened, is keeping him awake through the night and associated w/ post-tussive emesis early this morning.  Has nasal congestion but denies fever and sore throat as well as CP and SOB.  Pt recently contracted gastroenteritis from a family member.  N/V/D resolved 4 days ago.  He has had some abdominal bloating and diffuse, gassy lower abdominal pain for the past several days.  Most recent BM yesterday evening and normal.  Able to pass gas.  No h/o abdominal surgeries.  Denies urinary sx.  Past Medical History  Diagnosis Date  . Hypertension     Past Surgical History  Procedure Date  . Wrist surgery     History reviewed. No pertinent family history.  History  Substance Use Topics  . Smoking status: Never Smoker   . Smokeless tobacco: Never Used  . Alcohol Use: No      Review of Systems  All other systems reviewed and are negative.    Allergies  Review of patient's allergies indicates no known allergies.  Home Medications   Current Outpatient Rx  Name  Route  Sig  Dispense  Refill  . AMLODIPINE BESYLATE 2.5 MG PO TABS   Oral   Take 2.5 mg by mouth daily.         . ASPIRIN EFFERVESCENT 325 MG PO TBEF   Oral   Take 325 mg by mouth every 6 (six) hours as needed. Cold symptoms         . CARVEDILOL 12.5 MG PO TABS   Oral   Take 12.5 mg by mouth 2 (two) times daily with a meal.         . HYDROCODONE-HOMATROPINE 5-1.5 MG/5ML PO SYRP   Oral   Take 5 mLs by mouth every 6 (six) hours as needed for cough.   120 mL   0   . SIMVASTATIN 40 MG PO TABS   Oral   Take 40 mg by mouth at bedtime.           BP 123/90  Pulse 87  Temp 98.3 F (36.8 C) (Oral)  Resp 18  SpO2 96%  Physical Exam  Nursing note and vitals reviewed. Constitutional: He is oriented to person, place, and time. He appears well-developed and well-nourished. No distress.  HENT:  Head: Normocephalic and atraumatic.  Eyes:       Normal appearance  Neck: Normal range of motion.  Cardiovascular: Normal rate and regular rhythm.   Pulmonary/Chest: Effort normal and breath sounds normal. No respiratory distress. He has no wheezes.       coughing  Abdominal: Soft. Bowel sounds are normal. He exhibits no mass. There is no rebound and no guarding.       Mild distension.  Mild RLQ ttp.   Genitourinary:       No CVA tenderness  Musculoskeletal: Normal range of motion.  Neurological: He is alert and oriented to person, place, and time.  Skin:  Skin is warm and dry. No rash noted.  Psychiatric: He has a normal mood and affect. His behavior is normal.    ED Course  Procedures (including critical care time)  Labs Reviewed - No data to display Dg Chest 2 View  07/13/2012  *RADIOLOGY REPORT*  Clinical Data: Nausea and vomiting.  CHEST - 2 VIEW  Comparison: Chest radiograph performed 07/10/2012  Findings: The lungs are well-aerated and clear.  There is no evidence of focal opacification, pleural effusion or pneumothorax.  The heart is normal in size; the mediastinal contour is within normal limits.  No acute osseous abnormalities are seen.  IMPRESSION: No acute cardiopulmonary process seen.   Original Report Authenticated By: Tonia Ghent, M.D.      1. Bronchitis       MDM  [phone] M presents w/ ~2 weeks of cough, worsened since visit on 07/10/12 and now w/ single episode of post-tussive vomiting.  Neg CXR at that time, treated w/ hycodan which has given  him minimal relief.  On exam today, afebrile, VS w/in nml range, no respiratory distress, minimal coughing and nml breath sounds.  Repeat CXR neg for pneumonia. Results discussed w/ pt.  He understands that bronchitis can last for several weeks and that abx treatment is not indicated.  He received a nebulizer treatment in ED w/ relief.  Will send him home w/ an albuterol inhaler.  Also c/o several days of abdominal bloating and sensation of incomplete evacuation w/ BMs.  Abd mildy distended and tenderness in RLQ on exam.  Doubt SBO; no h/o abd surgeries, had a nml BM yesterday evening, is able to pass gas, minimal, localized tenderness on exam.  Re-examined after dose of zofran and abd no longer distended nor tender.  U/A was ordered but never obtained by nursing staff.  Pt denies having any urinary sx.  Advised him to f/u with his PCP for persistent cough and development of urinary sx.  Advised him to return for CP, SOB, worsening abd pain, uncontrolled vomiting, ect.        Otilio Miu, PA-C 07/13/12 [phone]

## 2012-07-13 NOTE — ED Notes (Signed)
Pt states a couple of days after thanksgiving started feeling nauseated, and emesis. Pt denies diarrhea. Pt sates that he was seen at [facility] and given hydrocortisone and cough medications. Pt still feels under the weather. Pt worried about flu.

## 2012-07-13 NOTE — ED Provider Notes (Signed)
Medical screening examination/treatment/procedure(s) were performed by non-physician practitioner and as supervising physician I was immediately available for consultation/collaboration.  Olivia Mackie, MD 07/13/12 [phone]

## 2012-09-10 ENCOUNTER — Encounter (HOSPITAL_COMMUNITY): Payer: Self-pay | Admitting: *Deleted

## 2012-09-10 DIAGNOSIS — Z7982 Long term (current) use of aspirin: Secondary | ICD-10-CM | POA: Insufficient documentation

## 2012-09-10 DIAGNOSIS — Z79899 Other long term (current) drug therapy: Secondary | ICD-10-CM | POA: Insufficient documentation

## 2012-09-10 DIAGNOSIS — M25519 Pain in unspecified shoulder: Secondary | ICD-10-CM | POA: Insufficient documentation

## 2012-09-10 DIAGNOSIS — M5380 Other specified dorsopathies, site unspecified: Secondary | ICD-10-CM | POA: Insufficient documentation

## 2012-09-10 DIAGNOSIS — Z87828 Personal history of other (healed) physical injury and trauma: Secondary | ICD-10-CM | POA: Insufficient documentation

## 2012-09-10 DIAGNOSIS — I10 Essential (primary) hypertension: Secondary | ICD-10-CM | POA: Insufficient documentation

## 2012-09-10 NOTE — ED Notes (Signed)
Pt states he took his BP 30 min PTA and it was high.  Took normal BP meds.  C/o numbness on top of head.  Also c/o right sided neck pain.

## 2012-09-10 NOTE — ED Notes (Signed)
Pt also c/o pain b/t shoulder blades.

## 2012-09-11 ENCOUNTER — Emergency Department (HOSPITAL_COMMUNITY)
Admission: EM | Admit: 2012-09-11 | Discharge: 2012-09-11 | Disposition: A | Discharge status: Home / Self Care | Attending: Emergency Medicine | Admitting: Emergency Medicine

## 2012-09-11 DIAGNOSIS — M62838 Other muscle spasm: Secondary | ICD-10-CM

## 2012-09-11 MED ORDER — METOCLOPRAMIDE HCL 5 MG/ML IJ SOLN
10.0000 mg | Freq: Once | INTRAMUSCULAR | Status: AC
  Administered 2012-09-11: 10 mg via INTRAVENOUS
  Filled 2012-09-11: qty 2

## 2012-09-11 MED ORDER — DIPHENHYDRAMINE HCL 50 MG/ML IJ SOLN
25.0000 mg | Freq: Once | INTRAMUSCULAR | Status: AC
  Administered 2012-09-11: 25 mg via INTRAVENOUS
  Filled 2012-09-11: qty 1

## 2012-09-11 MED ORDER — SODIUM CHLORIDE 0.9 % IV BOLUS (SEPSIS)
1000.0000 mL | Freq: Once | INTRAVENOUS | Status: AC
  Administered 2012-09-11: 1000 mL via INTRAVENOUS

## 2012-09-11 MED ORDER — DIAZEPAM 5 MG PO TABS: 5.0000 mg | ORAL_TABLET | Freq: Three times a day (TID) | ORAL | Status: DC | PRN

## 2012-09-11 NOTE — ED Provider Notes (Signed)
History     CSN: [id number]  Arrival date & time 09/10/12  2315   First MD Initiated Contact with Patient 09/11/12 0110      Chief Complaint  Patient presents with  . Headache  . Shoulder Pain    (Consider location/radiation/quality/duration/timing/severity/associated sxs/prior treatment) Patient is a 52 y.o. male presenting with headaches and shoulder pain. The history is provided by the patient.  Headache   Shoulder Pain Associated symptoms include headaches.  He has history of hypertension and chronic neck pain related to trauma that he suffered while he was in the [employer]. 3 days ago, he received a tetanus immunization and was told that it might make his neck worse. Today, he had pain in the right paracervical area which radiated up to the top of his head. Pain is moderate he rates it at 6/10. There is no associated numbness or tingling. There is no associated photophobia or phonophobia and there is no nausea or vomiting. He didn't check his blood pressure at home and noted it was very high at 210 and he states his blood pressures never been that high before. However, he had been seen at both the [facility] and his PCPs office this past week and his blood pressures been fine at that time.  Past Medical History  Diagnosis Date  . Hypertension     Past Surgical History  Procedure Date  . Wrist surgery     History reviewed. No pertinent family history.  History  Substance Use Topics  . Smoking status: Never Smoker   . Smokeless tobacco: Never Used  . Alcohol Use: No      Review of Systems  Neurological: Positive for headaches.  All other systems reviewed and are negative.    Allergies  Review of patient's allergies indicates no known allergies.  Home Medications   Current Outpatient Rx  Name  Route  Sig  Dispense  Refill  . AMLODIPINE BESYLATE 2.5 MG PO TABS   Oral   Take 2.5 mg by mouth daily.         . ASPIRIN EFFERVESCENT 325 MG PO TBEF   Oral  Take 325 mg by mouth every 6 (six) hours as needed. Cold symptoms         . CARVEDILOL 12.5 MG PO TABS   Oral   Take 12.5 mg by mouth 2 (two) times daily with a meal.         . HYDROCODONE-HOMATROPINE 5-1.5 MG/5ML PO SYRP   Oral   Take 5 mLs by mouth every 6 (six) hours as needed for cough.   120 mL   0   . ONDANSETRON HCL 8 MG PO TABS   Oral   Take 1 tablet (8 mg total) by mouth every 8 (eight) hours as needed for nausea.   20 tablet   0   . POLYETHYLENE GLYCOL 3350 PO PACK   Oral   Take 17 g by mouth daily.   5 each   0   . SIMVASTATIN 40 MG PO TABS   Oral   Take 40 mg by mouth at bedtime.           BP 159/98  Temp 97.6 F (36.4 C) (Oral)  Resp 18  SpO2 98%  Physical Exam  Nursing note and vitals reviewed.  52 year old male, resting comfortably and in no acute distress. Vital signs are significant for hypertension with blood pressure 159/98. Oxygen saturation is 98%, which is normal. Head is normocephalic and  atraumatic. PERRLA, EOMI. Oropharynx is clear. Fundi show no hemorrhage, exudate, or papilledema. Neck is significant for paracervical spasm which is greater on the right than on the left. This is mildly tender. However, the neck is supple without adenopathy or JVD. Back is nontender and there is no CVA tenderness. Lungs are clear without rales, wheezes, or rhonchi. Chest is nontender. Heart has regular rate and rhythm without murmur. Abdomen is soft, flat, nontender without masses or hepatosplenomegaly and peristalsis is normoactive. Extremities have no cyanosis or edema, full range of motion is present. Skin is warm and dry without rash. Neurologic: Mental status is normal, cranial nerves are intact, there are no motor or sensory deficits.  ED Course  Procedures (including critical care time)  Labs Reviewed - No data to display No results found.   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 When compared with ECG of 04/22/2011, no significant changes are seen.  Old EKG Reviewed: unchanged    1. Cervical paraspinal muscle spasm       MDM  Moderate hypertension which is not require emergent treatment. Paracervical muscle spasm with secondary to muscle contraction headache. He'll be given IV fluids and IV metoclopramide with diphenhydramine. Old records are reviewed and he has no prior visits with similar presentation the blood pressure previous visits was always lower than what current reading is.  He got complete relief of his symptoms with the above-noted medications. He will be given a prescription for diazepam to use as needed. He does have methocarbamol at home which he uses on an as-needed basis and he can use diazepam for methocarbamol does not get adequate relief. He is advised to follow up with his PCP regarding blood pressure control.      Dione Booze, MD 09/11/12 [phone]

## 2013-06-24 ENCOUNTER — Emergency Department (HOSPITAL_COMMUNITY)
Admission: EM | Admit: 2013-06-24 | Discharge: 2013-06-24 | Disposition: A | Discharge status: Home / Self Care | Attending: Emergency Medicine | Admitting: Emergency Medicine

## 2013-06-24 ENCOUNTER — Encounter (HOSPITAL_COMMUNITY): Payer: Self-pay | Admitting: Emergency Medicine

## 2013-06-24 DIAGNOSIS — Z79899 Other long term (current) drug therapy: Secondary | ICD-10-CM | POA: Insufficient documentation

## 2013-06-24 DIAGNOSIS — Y990 Civilian activity done for income or pay: Secondary | ICD-10-CM | POA: Insufficient documentation

## 2013-06-24 DIAGNOSIS — I10 Essential (primary) hypertension: Secondary | ICD-10-CM | POA: Insufficient documentation

## 2013-06-24 DIAGNOSIS — M25549 Pain in joints of unspecified hand: Secondary | ICD-10-CM | POA: Insufficient documentation

## 2013-06-24 DIAGNOSIS — Y9229 Other specified public building as the place of occurrence of the external cause: Secondary | ICD-10-CM | POA: Insufficient documentation

## 2013-06-24 DIAGNOSIS — T754XXA Electrocution, initial encounter: Secondary | ICD-10-CM | POA: Insufficient documentation

## 2013-06-24 DIAGNOSIS — W860XXA Exposure to domestic wiring and appliances, initial encounter: Secondary | ICD-10-CM | POA: Insufficient documentation

## 2013-06-24 LAB — CBC
Platelets: 158 10*3/uL (ref 150–400)
RBC: 4.82 MIL/uL (ref 4.22–5.81)
RDW: 13 % (ref 11.5–15.5)
WBC: 6.3 10*3/uL (ref 4.0–10.5)

## 2013-06-24 LAB — BASIC METABOLIC PANEL
CO2: 28 mEq/L (ref 19–32)
Chloride: 104 mEq/L (ref 96–112)
Creatinine, Ser: 0.83 mg/dL (ref 0.50–1.35)
GFR calc Af Amer: >90 mL/min (ref >90)
Potassium: 3.8 mEq/L (ref 3.5–5.1)
Sodium: 140 mEq/L (ref 135–145)

## 2013-06-24 LAB — CK: Total CK: 118 U/L (ref 7–232)

## 2013-06-24 LAB — POCT I-STAT TROPONIN I: Troponin i, poc: 0.01 ng/mL (ref 0.00–0.08)

## 2013-06-24 MED ORDER — IBUPROFEN 400 MG PO TABS
600.0000 mg | ORAL_TABLET | Freq: Once | ORAL | Status: AC
  Administered 2013-06-24: 600 mg via ORAL
  Filled 2013-06-24 (×2): qty 1

## 2013-06-24 MED ORDER — IBUPROFEN 600 MG PO TABS: 600.0000 mg | ORAL_TABLET | Freq: Once | ORAL | Status: DC
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
## 2013-06-24 NOTE — ED Notes (Signed)
Reconnected patient to diagnostic equipment.

## 2013-06-24 NOTE — ED Provider Notes (Signed)
CSN: [id number]     Arrival date & time 06/24/13  1824 History   First MD Initiated Contact with Patient 06/24/13 1951     Chief Complaint  Patient presents with  . Pain  . 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  He states he accidentally got stuck in the light switch.  His hand was struck to the ER until he knocked it off approximately 3 seconds.  Later.  Several hours later.  He noticed some charring to his fingers without underlying burn.  He managed to turn off.  The electricity and removed to to set the alarm to the school.  Go home.  Take several aspirin, which relieved his generalized discomfort, woke this morning.  Again, noticed he had some discomfort, again, took aspirin with relief of his discomfort, but when this wore off.  His pain returned.  He then decided to come to the emergency department, approximately 23 hours after the incident. He, states he has generalized muscle pain, no shortness of breath.  No nausea, and no dizziness. He has been able to perform all functions throughout the day.  Attend church.  Eat meals.  Has not contacted his primary care Dr..  The history is provided by the patient.    Past Medical History  Diagnosis Date  . Hypertension    Past Surgical History  Procedure Laterality Date  . Wrist surgery     No family history on file. History  Substance Use Topics  . Smoking status: Never Smoker   . Smokeless tobacco: Never Used  . Alcohol Use: No    Review of Systems  Constitutional: Negative for activity change.  Respiratory: Negative for shortness of breath.   Cardiovascular: Negative for chest pain.  Gastrointestinal: Negative for nausea and abdominal pain.  Musculoskeletal: Positive for myalgias.  Skin: Negative for wound.  All other systems reviewed and are  negative.    Allergies  Review of patient's allergies indicates no known allergies.  Home Medications   Current Outpatient Rx  Name  Route  Sig  Dispense  Refill  . acetaminophen (TYLENOL) 500 MG tablet   Oral   Take 1,000 mg by mouth every 4 (four) hours as needed for mild pain.         Marland Kitchen amLODipine (NORVASC) 2.5 MG tablet   Oral   Take 2.5 mg by mouth daily.         Marland Kitchen atorvastatin (LIPITOR) 40 MG tablet   Oral   Take 40 mg by mouth daily.         . carvedilol (COREG) 25 MG tablet   Oral   Take 12.5 mg by mouth 2 (two) times daily with a meal.         . Multiple Vitamin (MULTIVITAMIN WITH MINERALS) TABS   Oral   Take 1 tablet by mouth daily.         . Vitamin D, Ergocalciferol, (DRISDOL) 50000 UNITS CAPS capsule   Oral   Take 50,000 Units by mouth 2 (two) times a week. *takes on mondays and fridays*         . ibuprofen (ADVIL,MOTRIN) 600 MG tablet   Oral   Take 1 tablet (600 mg total) by mouth once.   30 tablet   0    BP 143/96  Pulse 77  Temp(Src) 98.3 F (36.8 C) (Oral)  Resp 20  Wt 166 lb (75.297 kg)  SpO2 98% Physical Exam  Vitals reviewed. Constitutional: He is oriented to person, place, and time. He appears well-developed and well-nourished.  HENT:  Head: Normocephalic.  Eyes: Pupils are equal, round, and reactive to light.  Neck: Normal range of motion.  Cardiovascular: Normal rate.   Pulmonary/Chest: Effort normal and breath sounds normal. He exhibits no tenderness.  Abdominal: Soft. Bowel sounds are normal.  Musculoskeletal: Normal range of motion. He exhibits no edema and no tenderness.  No burns noted on left hand  Neurological: He is oriented to person, place, and time.  Skin: Skin is warm. No rash noted. No erythema.    ED Course  Procedures (including critical care time) Labs Review Labs Reviewed  CBC  BASIC METABOLIC PANEL  CK  POCT I-STAT TROPONIN I   Imaging Review No results found.  EKG Interpretation      Ventricular Rate:  76 PR Interval:  164 QRS Duration: 90 QT Interval:  380 QTC Calculation: 427 R Axis:   4 Text Interpretation:  Normal sinus rhythm Normal ECG No significant change since last tracing            MDM   1. Electric shock, initial encounter     EKG, reviewed.  There is no change from previous EKGs.  Patient is in normal sinus rhythm Since total CK is 81 well within normal range.  He'll be discharged home with prescription for ibuprofen, and followup with his primary care physician.  If he continues to have discomfort    Arman Filter, NP 06/24/13 2048

## 2013-06-27 NOTE — ED Provider Notes (Signed)
Medical screening examination/treatment/procedure(s) were performed by non-physician practitioner and as supervising physician I was immediately available for consultation/collaboration.  EKG Interpretation    Date/Time:    Ventricular Rate:  76 PR Interval:  164 QRS Duration: 90 QT Interval:  380 QTC Calculation: 427 R Axis:   4 Text Interpretation:  Normal sinus rhythm Normal ECG No significant change since last tracing              Celene Kras, MD 06/27/13 [phone]

## 2014-03-10 ENCOUNTER — Encounter (HOSPITAL_BASED_OUTPATIENT_CLINIC_OR_DEPARTMENT_OTHER): Payer: Self-pay

## 2014-03-10 ENCOUNTER — Emergency Department (HOSPITAL_BASED_OUTPATIENT_CLINIC_OR_DEPARTMENT_OTHER)
Admission: RE | Admit: 2014-03-10 | Disposition: A | Discharge status: Home / Self Care | Payer: Self-pay | Source: Emergency Department | Attending: Physician Assistant | Admitting: Physician Assistant

## 2014-03-10 MED ORDER — CIPROFLOXACIN HCL 0.3 % OP OINT
TOPICAL_OINTMENT | Freq: Three times a day (TID) | OPHTHALMIC | Status: AC
Start: 2014-03-10 — End: 2014-03-17

## 2014-03-10 NOTE — Discharge Instructions (Signed)
Ciloxan - as prescribed.  Call [facility] for a follow-up appointment

## 2014-03-10 NOTE — ED Triage Note (Signed)
Patient here with c/o right eye redness, discharge and discomfort  Treated for a week for conjunctivitis   Not feeling better  No vision loss

## 2014-03-10 NOTE — Narrator Note (Signed)
Patient Disposition    Patient education for diagnosis, medications, activity, diet and follow-up.  Patient left ED 1:51 PM.  Patient rep received written instructions.  Interpreter to provide instructions: No    Have all existing LDAs been addressed? Yes    Have all IV infusions been stopped? Yes    Discharged to: Discharged to home

## 2014-04-09 NOTE — ED Provider Notes (Signed)
The mode of arrival today was Self on 03/10/2014 12:53 PM.       eMERGENCY dEPARTMENT ENCOUNTER      ED COURSE & MEDICAL DECISION MAKING    I reviewed the patient's past medical history/problem list, past surgical history, medication list, social history and allergies.   Arrival: Pt arrived in stable condition and required no immediate interventions.  Pertinent Labs & Imaging studies reviewed.  This is a 53 year old male presenting with a chief complaint of persistent conjunctivitis.  Vital signs: BP 142/109   Pulse 72   Temp(Src) 98.6 F   Resp 18   Wt 79.8 kg (175 lb 14.8 oz)   SpO2 99%.  Physical exam revealed conjunctivitis with discharge, but no evidence of orbital cellulitis.   The patient was treated with ciloxan ointment and was discharged home with a diagnosis of Conjunctivitis  (primary encounter diagnosis). They were instructed to follow-up with their primary care doctor within 1 week.    -------------------------------------------------------------------------------------------------------------------    The ED nursing record was reviewed.   The prior medical records as available electronically through Epic were reviewed.      CHIEF COMPLAINT    Patient presents with:  Eye Problem: RIGHT EYE PAIN      HPI    Jermaine Kirk is a 53 year old male who presents to the emergency department with c/o Patient here with c/o right eye redness, discharge and discomfort.  Treated for a week for conjunctivitis .  Not feeling better.  No vision loss      PAST MEDICAL HISTORY      Past Medical History    HTN (hypertension)        PROBLEM LIST  There is no problem list on file for this patient.      SURGICAL HISTORY    History reviewed. No pertinent past surgical history.    CURRENT MEDICATIONS    No current facility-administered medications for this encounter.  Current outpatient prescriptions:AMLODIPINE BESYLATE PO, Take  by mouth., Disp: , Rfl: ;  ATORVASTATIN CALCIUM PO, Take  by mouth., Disp: , Rfl:     ALLERGIES       Review of Patient's Allergies indicates:  No Known Allergies    FAMILY HISTORY    History reviewed. No pertinent family history.    SOCIAL HISTORY    Social History    Marital Status: Married             Spouse Name:                       Years of Education:                 Number of children:               Social History Main Topics    Smoking Status: Never Smoker                      Alcohol Use: No              Drug Use: No                REVIEW OF SYSTEMS    The pertinent positives are reviewed in the HPI above. All other systems were reviewed and are negative.    PHYSICAL EXAM      Triage Vital Signs:  BP 142/109   Pulse 72   Temp(Src) 98.6 F   Resp 18  Wt 79.8 kg (175 lb 14.8 oz)   SpO2 99%     03/10/14  1305   BP: 142/109   Pulse: 72   Temp: 98.6 F   Resp: 18   Weight: 79.8 kg (175 lb 14.8 oz)   SpO2: 99%       Constitutional: Well-developed, Well-nourished, No acute distress, Non-toxic appearance. Speaking full sentences.  Eyes: PERRL, conjunctiva pink and moist, conjunctival and scleral injection with assoc.discharge. No periorbital edema or cellulitic changes.  HENT: Atraumatic, TM's clear bilat., oropharynx moist, airway patent.  Neck: Normal range of motion, non-tender, supple, no stridor.   Lymphatic: No lymphadenopathy noted.   Extremities: Atraumatic. Full ROM TAE. Ambulatory with steady gait.  Skin: Warm &dry, no rashes, diaphoresis or cellulitic changes.  Neurological: CAOx3, Normal motor and sensory function, No focal deficits noted.   Psychiatric: Normal Affect.        MEDICATIONS ADMINISTERED ON THIS VISIT    Medication Orders Placed This Encounter  ciprofloxacin (CILOXAN) 0.3 % ophthalmic ointment   Sig: Place  into the right eye 3 (three) times daily.   Dispense:  1 tube   Refill:  1          CONDITION AT DISCHARGE  Stable and Improved    DISCHARGE DIAGNOSIS  Conjunctivitis  (primary encounter diagnosis)      FOLLOW-UP  The patient was discharge home in improved and stable condition.  The  patient was instructed to follow-up with their primary care physician within the next 3 days and to return sooner with any worsening concerns or complaints. The patient agrees with the emergency department management and disposition, and verbalized understanding of the discharge instructions prior to departure.     Electronically signed by:   Steffanie Rainwater, PAC, 04/09/2014 9:31 AM  Dept.of Emergency Medicine  [facility]    This Emergency Department patient encounter note was created using voice-recognition software and in real time during the ED visit. Please excuse any typographical errors that have not been edited out.

## 2014-06-15 ENCOUNTER — Encounter (HOSPITAL_COMMUNITY): Payer: Self-pay | Admitting: *Deleted

## 2014-06-15 ENCOUNTER — Emergency Department (HOSPITAL_COMMUNITY)
Admission: EM | Admit: 2014-06-15 | Discharge: 2014-06-15 | Disposition: A | Discharge status: Home / Self Care | Attending: Emergency Medicine | Admitting: Emergency Medicine

## 2014-06-15 DIAGNOSIS — Y9289 Other specified places as the place of occurrence of the external cause: Secondary | ICD-10-CM | POA: Insufficient documentation

## 2014-06-15 DIAGNOSIS — Y9389 Activity, other specified: Secondary | ICD-10-CM | POA: Insufficient documentation

## 2014-06-15 DIAGNOSIS — I10 Essential (primary) hypertension: Secondary | ICD-10-CM | POA: Insufficient documentation

## 2014-06-15 DIAGNOSIS — W228XXA Striking against or struck by other objects, initial encounter: Secondary | ICD-10-CM | POA: Diagnosis not present

## 2014-06-15 DIAGNOSIS — Z791 Long term (current) use of non-steroidal anti-inflammatories (NSAID): Secondary | ICD-10-CM | POA: Insufficient documentation

## 2014-06-15 DIAGNOSIS — S0990XA Unspecified injury of head, initial encounter: Secondary | ICD-10-CM | POA: Insufficient documentation

## 2014-06-15 DIAGNOSIS — Z79899 Other long term (current) drug therapy: Secondary | ICD-10-CM | POA: Insufficient documentation

## 2014-06-15 MED ORDER — ACETAMINOPHEN 325 MG PO TABS
650.0000 mg | ORAL_TABLET | Freq: Once | ORAL | Status: AC
  Administered 2014-06-15: 650 mg via ORAL
  Filled 2014-06-15: qty 2

## 2014-06-15 NOTE — ED Notes (Signed)
Pager [phone] 

## 2014-06-15 NOTE — ED Notes (Signed)
Pt verbalizes understanding of d/c instructions and denies any further needs at this time. 

## 2014-06-15 NOTE — ED Provider Notes (Signed)
CSN: [id number]     Arrival date & time 06/15/14  1844 History  This chart was scribed for non-physician practitioner, Jaynie Crumbleatyana Florence Yeung, PA-C,working with Gerhard Munchobert Lockwood, MD, by Karle PlumberJennifer Tensley, ED Scribe. This patient was seen in room TR06C/TR06C and the patient's care was started at 7:36 PM.  Chief Complaint  Patient presents with  . Head Injury   The history is provided by the patient. No language interpreter was used.    HPI Comments:  [name] is a 53 y.o. male with PMH of HTN who presents to the Emergency Department complaining of a head injury that he sustained yesterday morning. 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 He reports associated headache and states that upon waking this morning he did not "feel right". He has taken Advil for the HA with no significant relief of the pain. Denies alleviating or worsening factors. Denies nausea, vomiting, visual changes, dizziness, memory changes, confusion, bruising, bleeding or wounds, numbness, tingling or weakness of the extremities. He denies taking anticoagulants.  Past Medical History  Diagnosis Date  . Hypertension    Past Surgical History  Procedure Laterality Date  . Wrist surgery     History reviewed. No pertinent family history. History  Substance Use Topics  . Smoking status: Never Smoker   . Smokeless tobacco: Never Used  . Alcohol Use: No    Review of Systems  Constitutional: Negative for fever and chills.  Eyes: Negative for visual disturbance.  Gastrointestinal: Negative for nausea and vomiting.  Skin: Negative for wound.  Neurological: Positive for headaches. Negative for dizziness, syncope, weakness and numbness.  Psychiatric/Behavioral: Negative for confusion.    Allergies  Review of patient's allergies indicates no known allergies.  Home Medications   Prior to Admission medications   Medication Sig Start Date End Date Taking? Authorizing Provider  acetaminophen  (TYLENOL) 500 MG tablet Take 1,000 mg by mouth every 4 (four) hours as needed for mild pain.    Historical Provider, MD  amLODipine (NORVASC) 2.5 MG tablet Take 2.5 mg by mouth daily.    Historical Provider, MD  atorvastatin (LIPITOR) 40 MG tablet Take 40 mg by mouth daily.    Historical Provider, MD  carvedilol (COREG) 25 MG tablet Take 12.5 mg by mouth 2 (two) times daily with a meal.    Historical Provider, MD  ibuprofen (ADVIL,MOTRIN) 600 MG tablet Take 1 tablet (600 mg total) by mouth once. 06/24/13   Arman FilterGail K Schulz, NP  Multiple Vitamin (MULTIVITAMIN WITH MINERALS) TABS Take 1 tablet by mouth daily.    Historical Provider, MD  Vitamin D, Ergocalciferol, (DRISDOL) 50000 UNITS CAPS capsule Take 50,000 Units by mouth 2 (two) times a week. *takes on mondays and fridays*    Historical Provider, MD   Triage Vitals: BP 145/98 mmHg  Pulse 80  Temp(Src) 97.5 F (36.4 C) (Oral)  Resp 18  Ht 5\' 5"  (1.651 m)  Wt 174 lb (78.926 kg)  BMI 28.96 kg/m2  SpO2 97% Physical Exam  Constitutional: He is oriented to person, place, and time. He appears well-developed and well-nourished.  HENT:  Head: Normocephalic and atraumatic.  Right Ear: External ear normal.  Left Ear: External ear normal.  Nose: Nose normal.  Mouth/Throat: Oropharynx is clear and moist.  Eyes: Conjunctivae and EOM are normal. Pupils are equal, round, and reactive to light.  Neck: Normal range of motion. Neck supple.  Cardiovascular: Normal rate.   Pulmonary/Chest: Effort normal.  Musculoskeletal: Normal range of  motion.  Neurological: He is alert and oriented to person, place, and time.  5/5 and equal upper and lower extremity strength bilaterally. Equal grip strength bilaterally. Normal finger to nose and heel to shin. No pronator drift.   Skin: Skin is warm and dry.  Psychiatric: He has a normal mood and affect. His behavior is normal.  Nursing note and vitals reviewed.   ED Course  Procedures (including critical care  time) DIAGNOSTIC STUDIES: Oxygen Saturation is 97% on RA, normal by my interpretation.   COORDINATION OF CARE: 7:42 PM- Informed pt that his exam did not indicated CT imaging at this time. Return precautions discussed. Advised pt to take OTC medication for relief of his HA. Advised pt to follow up with PCP if symptoms persist. Pt verbalizes understanding and agrees to plan.  Medications - No data to display  Labs Review Labs Reviewed - No data to display  Imaging Review No results found.   EKG Interpretation None      MDM   Final diagnoses:  Minor head injury, initial encounter    Patient with a head injury yesterday. It has been over 24 hours. He had a shelf fall on his head. Based on [place] CT rule, patient does not require emergent CT scan this time. He is in no distress, his only complaint is headache. He had no amnesia, no loss of consciousness, no vomiting, normal neurological exam. Patient is not on any blood thinners. We'll discharge home with Tylenol and ibuprofen, follow-up as needed. Return precautions discussed  Filed Vitals:   06/15/14 1853 06/15/14 1957  BP: 145/98 134/87  Pulse: 80 70  Temp: 97.5 F (36.4 C) 97.7 F (36.5 C)  TempSrc: Oral Oral  Resp: 18 18  Height: 5\' 5"  (1.651 m)   Weight: 174 lb (78.926 kg)   SpO2: 97% 99%     I personally performed the services described in this documentation, which was scribed in my presence. The recorded information has been reviewed and is accurate.    Lottie Musselatyana A Timarion Agcaoili, PA-C 06/15/14 2159  Gerhard Munchobert Lockwood, MD 06/15/14 2330

## 2014-06-15 NOTE — ED Notes (Signed)
Pt reports hitting top of head on bookcase yesterday. Denies loc. Having a headache since then. Denies any memory loss or n/v. No acute distress noted at triage.

## 2014-06-15 NOTE — Discharge Instructions (Signed)
Tylenol and ibuprofen for pain. Return to ER if worsening headache, vomiting, dizziness, visual changes, any other concerning symptoms.    Concussion A concussion, or closed-head injury, is a brain injury caused by a direct blow to the head or by a quick and sudden movement (jolt) of the head or neck. Concussions are usually not life-threatening. Even so, the effects of a concussion can be serious. If you have had a concussion before, you are more likely to experience concussion-like symptoms after a direct blow to the head.  CAUSES  Direct blow to the head, such as from running into another player during a soccer game, being hit in a fight, or hitting your head on a hard surface.  A jolt of the head or neck that causes the brain to move back and forth inside the skull, such as in a car crash. SIGNS AND SYMPTOMS The signs of a concussion can be hard to notice. Early on, they may be missed by you, family members, and health care providers. You may look fine but act or feel differently. Symptoms are usually temporary, but they may last for days, weeks, or even longer. Some symptoms may appear right away while others may not show up for hours or days. Every head injury is different. Symptoms include:  Mild to moderate headaches that will not go away.  A feeling of pressure inside your head.  Having more trouble than usual:  Learning or remembering things you have heard.  Answering questions.  Paying attention or concentrating.  Organizing daily tasks.  Making decisions and solving problems.  Slowness in thinking, acting or reacting, speaking, or reading.  Getting lost or being easily confused.  Feeling tired all the time or lacking energy (fatigued).  Feeling drowsy.  Sleep disturbances.  Sleeping more than usual.  Sleeping less than usual.  Trouble falling asleep.  Trouble sleeping (insomnia).  Loss of balance or feeling lightheaded or dizzy.  Nausea or  vomiting.  Numbness or tingling.  Increased sensitivity to:  Sounds.  Lights.  Distractions.  Vision problems or eyes that tire easily.  Diminished sense of taste or smell.  Ringing in the ears.  Mood changes such as feeling sad or anxious.  Becoming easily irritated or angry for little or no reason.  Lack of motivation.  Seeing or hearing things other people do not see or hear (hallucinations). DIAGNOSIS Your health care provider can usually diagnose a concussion based on a description of your injury and symptoms. He or she will ask whether you passed out (lost consciousness) and whether you are having trouble remembering events that happened right before and during your injury. Your evaluation might include:  A brain scan to look for signs of injury to the brain. Even if the test shows no injury, you may still have a concussion.  Blood tests to be sure other problems are not present. TREATMENT  Concussions are usually treated in an emergency department, in urgent care, or at a clinic. You may need to stay in the hospital overnight for further treatment.  Tell your health care provider if you are taking any medicines, including prescription medicines, over-the-counter medicines, and natural remedies. Some medicines, such as blood thinners (anticoagulants) and aspirin, may increase the chance of complications. Also tell your health care provider whether you have had alcohol or are taking illegal drugs. This information may affect treatment.  Your health care provider will send you home with important instructions to follow.  How fast you will recover from  a concussion depends on many factors. These factors include how severe your concussion is, what part of your brain was injured, your age, and how healthy you were before the concussion.  Most people with mild injuries recover fully. Recovery can take time. In general, recovery is slower in older persons. Also, persons who  have had a concussion in the past or have other medical problems may find that it takes longer to recover from their current injury. HOME CARE INSTRUCTIONS General Instructions  Carefully follow the directions your health care provider gave you.  Only take over-the-counter or prescription medicines for pain, discomfort, or fever as directed by your health care provider.  Take only those medicines that your health care provider has approved.  Do not drink alcohol until your health care provider says you are well enough to do so. Alcohol and certain other drugs may slow your recovery and can put you at risk of further injury.  If it is harder than usual to remember things, write them down.  If you are easily distracted, try to do one thing at a time. For example, do not try to watch TV while fixing dinner.  Talk with family members or close friends when making important decisions.  Keep all follow-up appointments. Repeated evaluation of your symptoms is recommended for your recovery.  Watch your symptoms and tell others to do the same. Complications sometimes occur after a concussion. Older adults with a brain injury may have a higher risk of serious complications, such as a blood clot on the brain.  Tell your teachers, school nurse, school counselor, coach, athletic trainer, or work Freight forwarder about your injury, symptoms, and restrictions. Tell them about what you can or cannot do. They should watch for:  Increased problems with attention or concentration.  Increased difficulty remembering or learning new information.  Increased time needed to complete tasks or assignments.  Increased irritability or decreased ability to cope with stress.  Increased symptoms.  Rest. Rest helps the brain to heal. Make sure you:  Get plenty of sleep at night. Avoid staying up late at night.  Keep the same bedtime hours on weekends and weekdays.  Rest during the day. Take daytime naps or rest breaks  when you feel tired.  Limit activities that require a lot of thought or concentration. These include:  Doing homework or job-related work.  Watching TV.  Working on the computer.  Avoid any situation where there is potential for another head injury (football, hockey, soccer, basketball, martial arts, downhill snow sports and horseback riding). Your condition will get worse every time you experience a concussion. You should avoid these activities until you are evaluated by the appropriate follow-up health care providers. Returning To Your Regular Activities You will need to return to your normal activities slowly, not all at once. You must give your body and brain enough time for recovery.  Do not return to sports or other athletic activities until your health care provider tells you it is safe to do so.  Ask your health care provider when you can drive, ride a bicycle, or operate heavy machinery. Your ability to react may be slower after a brain injury. Never do these activities if you are dizzy.  Ask your health care provider about when you can return to work or school. Preventing Another Concussion It is very important to avoid another brain injury, especially before you have recovered. In rare cases, another injury can lead to permanent brain damage, brain swelling, or death.  The risk of this is greatest during the first 7-10 days after a head injury. Avoid injuries by:  Wearing a seat belt when riding in a car.  Drinking alcohol only in moderation.  Wearing a helmet when biking, skiing, skateboarding, skating, or doing similar activities.  Avoiding activities that could lead to a second concussion, such as contact or recreational sports, until your health care provider says it is okay.  Taking safety measures in your home.  Remove clutter and tripping hazards from floors and stairways.  Use grab bars in bathrooms and handrails by stairs.  Place non-slip mats on floors and in  bathtubs.  Improve lighting in dim areas. SEEK MEDICAL CARE IF:  You have increased problems paying attention or concentrating.  You have increased difficulty remembering or learning new information.  You need more time to complete tasks or assignments than before.  You have increased irritability or decreased ability to cope with stress.  You have more symptoms than before. Seek medical care if you have any of the following symptoms for more than 2 weeks after your injury:  Lasting (chronic) headaches.  Dizziness or balance problems.  Nausea.  Vision problems.  Increased sensitivity to noise or light.  Depression or mood swings.  Anxiety or irritability.  Memory problems.  Difficulty concentrating or paying attention.  Sleep problems.  Feeling tired all the time. SEEK IMMEDIATE MEDICAL CARE IF:  You have severe or worsening headaches. These may be a sign of a blood clot in the brain.  You have weakness (even if only in one hand, leg, or part of the face).  You have numbness.  You have decreased coordination.  You vomit repeatedly.  You have increased sleepiness.  One pupil is larger than the other.  You have convulsions.  You have slurred speech.  You have increased confusion. This may be a sign of a blood clot in the brain.  You have increased restlessness, agitation, or irritability.  You are unable to recognize people or places.  You have neck pain.  It is difficult to wake you up.  You have unusual behavior changes.  You lose consciousness. MAKE SURE YOU:  Understand these instructions.  Will watch your condition.  Will get help right away if you are not doing well or get worse. Document Released: 10/16/2003 Document Revised: 07/31/2013 Document Reviewed: 02/15/2013 [facility] Patient Information 2015 [place], [place]. This information is not intended to replace advice given to you by your health care provider. Make sure you discuss any  questions you have with your health care provider.

## 2014-08-09 ENCOUNTER — Encounter (HOSPITAL_COMMUNITY): Payer: Self-pay | Admitting: Emergency Medicine

## 2014-08-09 ENCOUNTER — Ambulatory Visit (HOSPITAL_COMMUNITY): Attending: Emergency Medicine

## 2014-08-09 ENCOUNTER — Emergency Department (INDEPENDENT_AMBULATORY_CARE_PROVIDER_SITE_OTHER)
Admission: EM | Admit: 2014-08-09 | Discharge: 2014-08-09 | Disposition: A | Discharge status: Home / Self Care | Source: Home / Self Care | Attending: Emergency Medicine | Admitting: Emergency Medicine

## 2014-08-09 DIAGNOSIS — R05 Cough: Secondary | ICD-10-CM | POA: Diagnosis not present

## 2014-08-09 DIAGNOSIS — I10 Essential (primary) hypertension: Secondary | ICD-10-CM | POA: Insufficient documentation

## 2014-08-09 DIAGNOSIS — R509 Fever, unspecified: Secondary | ICD-10-CM | POA: Insufficient documentation

## 2014-08-09 DIAGNOSIS — R059 Cough, unspecified: Secondary | ICD-10-CM

## 2014-08-09 DIAGNOSIS — J189 Pneumonia, unspecified organism: Secondary | ICD-10-CM

## 2014-08-09 DIAGNOSIS — R0602 Shortness of breath: Secondary | ICD-10-CM | POA: Diagnosis not present

## 2014-08-09 MED ORDER — AMOXICILLIN-POT CLAVULANATE 875-125 MG PO TABS: 1.0000 | ORAL_TABLET | Freq: Two times a day (BID) | ORAL | Status: DC

## 2014-08-09 MED ORDER — HYDROCOD POLST-CHLORPHEN POLST 10-8 MG/5ML PO LQCR: 5.0000 mL | Freq: Two times a day (BID) | ORAL | Status: DC | PRN

## 2014-08-09 MED ORDER — AZITHROMYCIN 250 MG PO TABS: ORAL_TABLET | ORAL | Status: DC

## 2014-08-09 MED ORDER — CEFTRIAXONE SODIUM 1 G IJ SOLR
INTRAMUSCULAR | Status: AC
  Filled 2014-08-09: qty 10

## 2014-08-09 MED ORDER — CEFTRIAXONE SODIUM 1 G IJ SOLR
1.0000 g | Freq: Once | INTRAMUSCULAR | Status: AC
  Administered 2014-08-09: 1 g via INTRAMUSCULAR

## 2014-08-09 MED ORDER — LIDOCAINE HCL (PF) 1 % IJ SOLN
INTRAMUSCULAR | Status: AC
  Filled 2014-08-09: qty 5

## 2014-08-09 NOTE — ED Notes (Signed)
C/o cold sx onset Monday Sx include: productive cough, runny nose, congestion and fever Has been taking Theraflu and ibup w/no relief Alert, no signs of acute distress.

## 2014-08-09 NOTE — Discharge Instructions (Signed)

## 2014-08-09 NOTE — ED Provider Notes (Signed)
Chief Complaint   No chief complaint on file.   History of Present Illness   [name] is a 54 year old male who has had a five-day history that began with tiredness and fatigue. Then he became hoarse, had a sore throat mostly on the left and a dry feeling in his throat. He's had a cough productive of clear degrees sputum, wheezing, chest tightness, difficulty breathing. He's had a fever to 100.4, chills, sweats, nasal congestion, clear rhinorrhea, sinus pressure, and diarrhea. Symptoms began while he was on a trip to [place]. He's tried Motrin and there fluid without any improvement.  Review of Systems   Other than as noted above, the patient denies any of the following symptoms: Systemic:  No fevers, chills, sweats, or myalgias. Eye:  No redness or discharge. ENT:  No ear pain, headache, nasal congestion, drainage, sinus pressure, or sore throat. Neck:  No neck pain, stiffness, or swollen glands. Lungs:  No cough, sputum production, hemoptysis, wheezing, chest tightness, shortness of breath or chest pain. GI:  No abdominal pain, nausea, vomiting or diarrhea.  PMFSH   Past medical history, family history, social history, meds, and allergies were reviewed. He has high blood pressure and is on amlodipine and carvedilol and also takes Lipitor for hypercholesterolemia.  Physical exam   Vital signs:  BP 143/108 mmHg  Pulse 78  Temp(Src) 97.9 F (36.6 C) (Oral)  Resp 16  SpO2 97% General:  Alert and oriented.  In no distress.  Skin warm and dry. Eye:  No conjunctival injection or drainage. Lids were normal. ENT:  TMs and canals were normal, without erythema or inflammation.  Nasal mucosa was clear and uncongested, without drainage.  Mucous membranes were moist.  Pharynx was clear with no exudate or drainage.  There were no oral ulcerations or lesions. Neck:  Supple, no adenopathy, tenderness or mass. Lungs:  No respiratory distress.  He has rales at the right base, no  wheezes or rhonchi, breath sounds were equal bilaterally.  Heart:  Regular rhythm, without gallops, murmers or rubs. Skin:  Clear, warm, and dry, without rash or lesions.   Radiology   Dg Chest 2 View  08/09/2014   CLINICAL DATA:  Three day history of productive cough, fever and shortness of breath. Current history of hypertension.  EXAM: CHEST  2 VIEW  COMPARISON:  07/13/2012 dating back to 01/07/2008.  FINDINGS: Cardiomediastinal silhouette unremarkable, unchanged. Mildly prominent bronchovascular markings diffusely with moderate central peribronchial thickening, more so than on the prior examinations. Patchy airspace opacities in the anteromedial basal segment of the right lower lobe. Lungs otherwise clear. No pleural effusions. No pneumothorax. Mild degenerative changes involving the thoracic spine. 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  IMPRESSION: Right lower lobe pneumonia superimposed upon moderate changes of acute bronchitis and/or asthma.   Electronically Signed   By: Hulan Saas M.D.   On: 08/09/2014 14:34   Course in Urgent Care Center   The following medications were given:  Medications  cefTRIAXone (ROCEPHIN) injection 1 g (1 g Intramuscular Given 08/09/14 1510)   Assessment     The primary encounter diagnosis was Community acquired pneumonia. A diagnosis of Cough was also pertinent to this visit.   CURB65 score is 1, indicating outpatient therapy is appropriate.  Plan    1.  Meds:  The following meds were prescribed:   Discharge Medication List as of 08/09/2014  2:54 PM    START taking these medications  Details  amoxicillin-clavulanate (AUGMENTIN) 875-125 MG per tablet Take 1 tablet by mouth 2 (two) times daily., Starting 08/09/2014, Until Discontinued, Normal    azithromycin (ZITHROMAX Z-PAK) 250 MG tablet Take as directed., Normal    chlorpheniramine-HYDROcodone (TUSSIONEX) 10-8 MG/5ML LQCR Take 5 mLs by mouth  every 12 (twelve) hours as needed for cough., Starting 08/09/2014, Until Discontinued, Normal        2.  Patient Education/Counseling:  The patient was given appropriate handouts, self care instructions, and instructed in symptomatic relief.  Instructed to get extra fluids and extra rest.    3.  Follow up:  The patient was told to follow up here if no better in 3 to 4 days, or sooner if becoming worse in any way, and given some red flag symptoms such as increasing fever, difficulty breathing, chest pain, or persistent vomiting which would prompt immediate return.       Reuben Likes, MD 08/09/14 [phone]

## 2014-08-11 ENCOUNTER — Encounter (HOSPITAL_COMMUNITY): Payer: Self-pay

## 2014-08-11 ENCOUNTER — Emergency Department (INDEPENDENT_AMBULATORY_CARE_PROVIDER_SITE_OTHER)
Admission: EM | Admit: 2014-08-11 | Discharge: 2014-08-11 | Disposition: A | Discharge status: Home / Self Care | Source: Home / Self Care | Attending: Family Medicine | Admitting: Family Medicine

## 2014-08-11 DIAGNOSIS — Z09 Encounter for follow-up examination after completed treatment for conditions other than malignant neoplasm: Secondary | ICD-10-CM

## 2014-08-11 DIAGNOSIS — J189 Pneumonia, unspecified organism: Secondary | ICD-10-CM

## 2014-08-11 NOTE — Discharge Instructions (Signed)
Pneumonia, Adult  Pneumonia is an infection of the lungs. It may be caused by a germ (virus or bacteria). Some types of pneumonia can spread easily from person to person. This can happen when you cough or sneeze.  HOME CARE   Only take medicine as told by your doctor.   Take your medicine (antibiotics) as told. Finish it even if you start to feel better.   Do not smoke.   You may use a vaporizer or humidifier in your room. This can help loosen thick spit (mucus).   Sleep so you are almost sitting up (semi-upright). This helps reduce coughing.   Rest.  A shot (vaccine) can help prevent pneumonia. Shots are often advised for:   People over 65 years old.   Patients on chemotherapy.   People with long-term (chronic) lung problems.   People with immune system problems.  GET HELP RIGHT AWAY IF:    You are getting worse.   You cannot control your cough, and you are losing sleep.   You cough up blood.   Your pain gets worse, even with medicine.   You have a fever.   Any of your problems are getting worse, not better.   You have shortness of breath or chest pain.  MAKE SURE YOU:    Understand these instructions.   Will watch your condition.   Will get help right away if you are not doing well or get worse.  Document Released: 01/12/2008 Document Revised: 10/18/2011 Document Reviewed: 10/16/2010  [employer] Patient Information 2015 [employer]. This information is not intended to replace advice given to you by your health care provider. Make sure you discuss any questions you have with your health care provider.

## 2014-08-11 NOTE — ED Provider Notes (Signed)
CSN: [id number]     Arrival date & time 08/11/14  1007 History   First MD Initiated Contact with Patient 08/11/14 1026     Chief Complaint  Patient presents with  . Follow-up   (Consider location/radiation/quality/duration/timing/severity/associated sxs/prior Treatment) HPI Comments: This 54 year old male patient was seen in this urgent care 2 days ago with a diagnosis of community-acquired pneumonia. He was treated with azithromycin and Augmentin. He is here for a follow-up. He states he is feeling better and denies chest pain, fever or night sweats. He does have an occasional cough.   Past Medical History  Diagnosis Date  . Hypertension    Past Surgical History  Procedure Laterality Date  . Wrist surgery     History reviewed. No pertinent family history. History  Substance Use Topics  . Smoking status: Never Smoker   . Smokeless tobacco: Never Used  . Alcohol Use: No    Review of Systems  Constitutional: Negative for fever.  HENT: Negative.   Respiratory: Positive for cough. Negative for shortness of breath.   Cardiovascular: Negative for chest pain.  Gastrointestinal: Negative.   Neurological: Negative.     Allergies  Review of patient's allergies indicates no known allergies.  Home Medications   Prior to Admission medications   Medication Sig Start Date End Date Taking? Authorizing Provider  acetaminophen (TYLENOL) 500 MG tablet Take 1,000 mg by mouth every 4 (four) hours as needed for mild pain.    Historical Provider, MD  amLODipine (NORVASC) 2.5 MG tablet Take 2.5 mg by mouth daily.    Historical Provider, MD  amoxicillin-clavulanate (AUGMENTIN) 875-125 MG per tablet Take 1 tablet by mouth 2 (two) times daily. 08/09/14   Reuben Likes, MD  atorvastatin (LIPITOR) 40 MG tablet Take 40 mg by mouth daily.    Historical Provider, MD  azithromycin (ZITHROMAX Z-PAK) 250 MG tablet Take as directed. 08/09/14   Reuben Likes, MD  carvedilol (COREG) 25 MG tablet Take 12.5 mg  by mouth 2 (two) times daily with a meal.    Historical Provider, MD  chlorpheniramine-HYDROcodone (TUSSIONEX) 10-8 MG/5ML LQCR Take 5 mLs by mouth every 12 (twelve) hours as needed for cough. 08/09/14   Reuben Likes, MD  ibuprofen (ADVIL,MOTRIN) 600 MG tablet Take 1 tablet (600 mg total) by mouth once. 06/24/13   Arman Filter, NP  Multiple Vitamin (MULTIVITAMIN WITH MINERALS) TABS Take 1 tablet by mouth daily.    Historical Provider, MD  Vitamin D, Ergocalciferol, (DRISDOL) 50000 UNITS CAPS capsule Take 50,000 Units by mouth 2 (two) times a week. *takes on mondays and fridays*    Historical Provider, MD   BP 138/67 mmHg  Pulse 87  Temp(Src) 99 F (37.2 C) (Oral)  Resp 12  SpO2 96% Physical Exam  Constitutional: He is oriented to person, place, and time. He appears well-developed and well-nourished. No distress.  Neck: Normal range of motion. Neck supple.  Cardiovascular: Normal rate, regular rhythm and normal heart sounds.   No murmur heard. Pulmonary/Chest: Effort normal and breath sounds normal. No respiratory distress. He has no wheezes. He has no rales.  Musculoskeletal: He exhibits no edema.  Neurological: He is alert and oriented to person, place, and time.  Skin: Skin is warm and dry. He is not diaphoretic.  Psychiatric: He has a normal mood and affect.  Nursing note and vitals reviewed.   ED Course  Procedures (including critical care time) Labs Review Labs Reviewed - No data to display  Imaging Review Dg  Chest 2 View  08/09/2014   CLINICAL DATA:  Three day history of productive cough, fever and shortness of breath. Current history of hypertension.  EXAM: CHEST  2 VIEW  COMPARISON:  07/13/2012 dating back to 01/07/2008.  FINDINGS: Cardiomediastinal silhouette unremarkable, unchanged. Mildly prominent bronchovascular markings diffusely with moderate central peribronchial thickening, more so than on the prior examinations. Patchy airspace opacities in the anteromedial basal  segment of the right lower lobe. Lungs otherwise clear. No pleural effusions. No pneumothorax. Mild degenerative changes involving the thoracic spine. 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  IMPRESSION: Right lower lobe pneumonia superimposed upon moderate changes of acute bronchitis and/or asthma.   Electronically Signed   By: Hulan Saas M.D.   On: 08/09/2014 14:34     MDM   1. Follow-up exam after treatment   2. CAP (community acquired pneumonia)    Symptoms are resolving patient feeling better. Continue the ABX until all gone Stay well hydrated For worsening return.    Hayden Rasmussen, NP 08/11/14 1116

## 2014-08-11 NOTE — ED Notes (Signed)
I think I feel better

## 2014-08-15 ENCOUNTER — Emergency Department (INDEPENDENT_AMBULATORY_CARE_PROVIDER_SITE_OTHER)
Admission: EM | Admit: 2014-08-15 | Discharge: 2014-08-15 | Disposition: A | Discharge status: Home / Self Care | Source: Home / Self Care | Attending: Emergency Medicine | Admitting: Emergency Medicine

## 2014-08-15 ENCOUNTER — Encounter (HOSPITAL_COMMUNITY): Payer: Self-pay | Admitting: *Deleted

## 2014-08-15 DIAGNOSIS — J189 Pneumonia, unspecified organism: Secondary | ICD-10-CM

## 2014-08-15 DIAGNOSIS — Z09 Encounter for follow-up examination after completed treatment for conditions other than malignant neoplasm: Secondary | ICD-10-CM

## 2014-08-15 MED ORDER — BENZONATATE 100 MG PO CAPS: 100.0000 mg | ORAL_CAPSULE | Freq: Two times a day (BID) | ORAL | Status: DC | PRN

## 2014-08-15 NOTE — Discharge Instructions (Signed)
You are recovering well from the pneumonia. The cough will likely linger another few weeks. It may take up to another week or 2 for the fatigue to resolve.  Make sure you are getting plenty of fluids and a varied diet. Use the Tessalon Perls twice a day as needed for cough. You can also use a teaspoon of honey every 1-2 hours as needed for cough.  If you have recurrence of fevers or trouble breathing, please come back.

## 2014-08-15 NOTE — ED Notes (Addendum)
Pt  States  He  Was  Seen  6  Days  Ago  For    pnuemonia  He  Is  Here  Today  For  A  followup       He  Continues  To  Cough  And  He  Reports  He  Feels  Somewhat weak  He reports  He       Is taking  His meds

## 2014-08-15 NOTE — ED Provider Notes (Signed)
CSN: [id number]     Arrival date & time 08/15/14  1045 History   First MD Initiated Contact with Patient 08/15/14 1118     Chief Complaint  Patient presents with  . Follow-up   (Consider location/radiation/quality/duration/timing/severity/associated sxs/prior Treatment) HPI  He is a 54 year old man here for follow-up of pneumonia. He is diagnosed with a right lower lobe pneumonia on New Year's Day. He was treated with azithromycin and Augmentin. He has been taking the medications as prescribed. He states he was doing well except for a persistent cough. He went to work on Wednesday, and is now experiencing some fatigue. Date the cough is bad enough at times where it causes posttussive emesis. He has not been using the Tussionex cough syrup.  No fevers or chills. No shortness of breath.  Past Medical History  Diagnosis Date  . Hypertension    Past Surgical History  Procedure Laterality Date  . Wrist surgery     History reviewed. No pertinent family history. History  Substance Use Topics  . Smoking status: Never Smoker   . Smokeless tobacco: Never Used  . Alcohol Use: No    Review of Systems  Constitutional: Positive for fatigue. Negative for fever and chills.  HENT: Negative.   Respiratory: Positive for cough. Negative for shortness of breath.   Gastrointestinal: Positive for vomiting (post-tussive).    Allergies  Review of patient's allergies indicates no known allergies.  Home Medications   Prior to Admission medications   Medication Sig Start Date End Date Taking? Authorizing Provider  acetaminophen (TYLENOL) 500 MG tablet Take 1,000 mg by mouth every 4 (four) hours as needed for mild pain.    Historical Provider, MD  amLODipine (NORVASC) 2.5 MG tablet Take 2.5 mg by mouth daily.    Historical Provider, MD  amoxicillin-clavulanate (AUGMENTIN) 875-125 MG per tablet Take 1 tablet by mouth 2 (two) times daily. 08/09/14   Reuben Likes, MD  atorvastatin (LIPITOR) 40 MG tablet  Take 40 mg by mouth daily.    Historical Provider, MD  azithromycin (ZITHROMAX Z-PAK) 250 MG tablet Take as directed. 08/09/14   Reuben Likes, MD  benzonatate (TESSALON) 100 MG capsule Take 1 capsule (100 mg total) by mouth 2 (two) times daily as needed for cough. 08/15/14   Charm Rings, MD  carvedilol (COREG) 25 MG tablet Take 12.5 mg by mouth 2 (two) times daily with a meal.    Historical Provider, MD  chlorpheniramine-HYDROcodone (TUSSIONEX) 10-8 MG/5ML LQCR Take 5 mLs by mouth every 12 (twelve) hours as needed for cough. 08/09/14   Reuben Likes, MD  ibuprofen (ADVIL,MOTRIN) 600 MG tablet Take 1 tablet (600 mg total) by mouth once. 06/24/13   Arman Filter, NP  Multiple Vitamin (MULTIVITAMIN WITH MINERALS) TABS Take 1 tablet by mouth daily.    Historical Provider, MD  Vitamin D, Ergocalciferol, (DRISDOL) 50000 UNITS CAPS capsule Take 50,000 Units by mouth 2 (two) times a week. *takes on mondays and fridays*    Historical Provider, MD   BP 137/89 mmHg  Pulse 73  Temp(Src) 98.6 F (37 C) (Oral)  Resp 16  SpO2 98% Physical Exam  Constitutional: He is oriented to person, place, and time. He appears well-developed and well-nourished. No distress.  HENT:  Head: Normocephalic and atraumatic.  Neck: Neck supple.  Cardiovascular: Normal rate, regular rhythm and normal heart sounds.   No murmur heard. Pulmonary/Chest: Effort normal and breath sounds normal. No respiratory distress. He has no wheezes. He has no  rales.  Neurological: He is alert and oriented to person, place, and time.    ED Course  Procedures (including critical care time) Labs Review Labs Reviewed - No data to display  Imaging Review No results found.   MDM   1. Follow-up exam    He appears to be recovering well from pneumonia. He does have some residual cough and fatigue, which I discussed is normal. Provided prescription for Tessalon Perles to use twice daily as needed for cough. Follow-up if he develops recurrent  fevers or difficulty breathing.    Charm RingsErin J Theia Dezeeuw, MD 08/15/14 [id number]

## 2015-01-27 ENCOUNTER — Other Ambulatory Visit: Payer: Self-pay | Admitting: Neurological Surgery

## 2015-02-19 ENCOUNTER — Encounter (HOSPITAL_COMMUNITY)
Admission: RE | Admit: 2015-02-19 | Discharge: 2015-02-19 | Disposition: A | Discharge status: Home / Self Care | Source: Ambulatory Visit | Attending: Neurological Surgery | Admitting: Neurological Surgery

## 2015-02-19 ENCOUNTER — Encounter (HOSPITAL_COMMUNITY): Payer: Self-pay

## 2015-02-19 DIAGNOSIS — K219 Gastro-esophageal reflux disease without esophagitis: Secondary | ICD-10-CM | POA: Diagnosis not present

## 2015-02-19 DIAGNOSIS — Z01818 Encounter for other preprocedural examination: Secondary | ICD-10-CM | POA: Diagnosis not present

## 2015-02-19 DIAGNOSIS — M1990 Unspecified osteoarthritis, unspecified site: Secondary | ICD-10-CM | POA: Insufficient documentation

## 2015-02-19 DIAGNOSIS — I10 Essential (primary) hypertension: Secondary | ICD-10-CM | POA: Insufficient documentation

## 2015-02-19 DIAGNOSIS — Z01812 Encounter for preprocedural laboratory examination: Secondary | ICD-10-CM | POA: Insufficient documentation

## 2015-02-19 DIAGNOSIS — Z01810 Encounter for preprocedural cardiovascular examination: Secondary | ICD-10-CM | POA: Insufficient documentation

## 2015-02-19 DIAGNOSIS — M5020 Other cervical disc displacement, unspecified cervical region: Secondary | ICD-10-CM | POA: Diagnosis not present

## 2015-02-19 HISTORY — DX: Failed or difficult intubation, initial encounter: T88.4XXA

## 2015-02-19 HISTORY — DX: Gastro-esophageal reflux disease without esophagitis: K21.9

## 2015-02-19 HISTORY — DX: Unspecified osteoarthritis, unspecified site: M19.90

## 2015-02-19 HISTORY — DX: Other complications of anesthesia, initial encounter: T88.59XA

## 2015-02-19 HISTORY — DX: Adverse effect of unspecified anesthetic, initial encounter: T41.45XA

## 2015-02-19 LAB — CBC
HCT: 42.9 % (ref 39.0–52.0)
Hemoglobin: 14.4 g/dL (ref 13.0–17.0)
MCH: 29.3 pg (ref 26.0–34.0)
MCHC: 33.6 g/dL (ref 30.0–36.0)
MCV: 87.4 fL (ref 78.0–100.0)
Platelets: 160 10*3/uL (ref 150–400)
RBC: 4.91 MIL/uL (ref 4.22–5.81)
RDW: 13.2 % (ref 11.5–15.5)
WBC: 5.3 10*3/uL (ref 4.0–10.5)

## 2015-02-19 LAB — SURGICAL PCR SCREEN
MRSA, PCR: NEGATIVE
Staphylococcus aureus: NEGATIVE

## 2015-02-19 NOTE — Pre-Procedure Instructions (Signed)
Durenda Guthrienthony Ales  02/19/2015      [id number]- [place], [place] - [address] [place] [id number] Phone: [phone] Fax: [phone]    Your procedure is scheduled on Monday, July 18th .  [facility] Admitting at 8:45 AM             (Arrival time is per your surgeon's request)   Call this number if you have problems the morning of surgery:  [phone]   Remember:  Do not eat food or drink liquids after midnight Sunday.  Take these medicines the morning of surgery with A SIP OF WATER : Amlodipine, Carvedilol   Do not wear jewelry - no rings or watches.  Do not wear lotions or colognes.  You may NOT wear deodorant the morning of surgery.             Men may shave face and neck.   Do not bring valuables to the hospital.  [facility] is not responsible for any belongings or valuables.  Contacts, dentures or bridgework may not be worn into surgery.  Leave your suitcase in the car.  After surgery it may be brought to your room. For patients admitted to the hospital, discharge time will be determined by your treatment team.   Name and phone number of your driver:     Special instructions: "Preparing for Surgery" instruction sheet.  Please read over the following fact sheets that you were given. Pain Booklet, Coughing and Deep Breathing, MRSA Information and Surgical Site Infection Prevention

## 2015-02-19 NOTE — Progress Notes (Signed)
In 04/2011, [facility] Dr. Mina MarbleWeingold.  Evidentally had some ? Intubation problems or per the ER records, had been coughing up blood upon extubation, was sent to our ER and had "diffuse fluffy bilateral airspace opacification in both lungs".   Stayed in hospital for a couple of days and then sent home. He's not really sure what exactly happed. I have faxed a request for the anesthesia record from the [facility]. Also had a sleep study test done @ [facility] off [facility] [address] and the results were " negative". Patient saw Dr. Petra KubaKilpatrick "yrs ago and didn't care of him, so he has not seen him in yrs.

## 2015-02-20 ENCOUNTER — Encounter (HOSPITAL_COMMUNITY): Payer: Self-pay

## 2015-02-20 NOTE — Progress Notes (Signed)
Anesthesia Chart Review: patient is a 54 year old male scheduled for C5-6 artificial disc replacement on 02/24/15 by Dr. Danielle DessElsner.   History includes non-smoker, arthritis, GERD, HTN, right wrist surgery, vasectomy.  Patient reported a history of "negative" sleep study. PCP is listed as Dr. Dorothyann Pengobyn Sanders.  For ANESTHESIA HISTORY he reported a vague history of possible difficult intubation on 04/21/11 and having to be admitted post-operatively for SOB.  [facility]esthesia [facility] obtained [facility] and records also reviewed from his admission to [facility].  LMA was used--not ETT. Anesthesia records (see copies on chart) indicate that an LMA was used for right wrist surgery. Procedure lasted form approximately one hour. At some point his LMA was noted to be displaced. Patient was breathing. Oral and nasal airways were inserted. Sats in the 80's. Unsuccessful attempt at reinserting the LMA. Procedure ended. Sats up to 88% then 93%.  He remained in the OR room for approximately 12 minutes until sats improved.  While in PACU noted to have bloody frothy sputum.  He was placed on a non-rebreather mask and transferred to [facility] ED. CXR showed diffuse fluffy bilateral airspace opacification bilaterally. He was admitted for post-operative pneumonitis and started on clindamycin for possible aspiration. Pulmonary was consulted. He was discharged home on 04/23/11.   Meds include amlodipine, Lipitor, Coreg.  02/19/15 EKG: NSR.  Preoperative labs noted. For unknown reasons, BMET was not done at PAT, so it will need to be done on the day of surgery.  Velna Ochsllison Zelenak, PA-C [facility]/Anesthesiology Phone [phone] 02/20/2015 10:43 AM

## 2015-02-21 NOTE — Progress Notes (Signed)
Unable to obtain sleep study as unsure of location "[place] [address]"

## 2015-02-24 ENCOUNTER — Ambulatory Visit (HOSPITAL_COMMUNITY)

## 2015-02-24 ENCOUNTER — Encounter (HOSPITAL_COMMUNITY)
Admission: RE | Disposition: A | Discharge status: Home / Self Care | Payer: Self-pay | Source: Ambulatory Visit | Attending: Neurological Surgery

## 2015-02-24 ENCOUNTER — Ambulatory Visit (HOSPITAL_COMMUNITY)
Admission: RE | Admit: 2015-02-24 | Discharge: 2015-02-24 | Disposition: A | Discharge status: Home / Self Care | Source: Ambulatory Visit | Attending: Neurological Surgery | Admitting: Neurological Surgery

## 2015-02-24 ENCOUNTER — Ambulatory Visit (HOSPITAL_COMMUNITY): Admitting: Certified Registered Nurse Anesthetist

## 2015-02-24 ENCOUNTER — Ambulatory Visit (HOSPITAL_COMMUNITY): Admitting: Vascular Surgery

## 2015-02-24 ENCOUNTER — Encounter (HOSPITAL_COMMUNITY): Payer: Self-pay | Admitting: *Deleted

## 2015-02-24 DIAGNOSIS — M5412 Radiculopathy, cervical region: Secondary | ICD-10-CM | POA: Diagnosis present

## 2015-02-24 DIAGNOSIS — H9190 Unspecified hearing loss, unspecified ear: Secondary | ICD-10-CM | POA: Insufficient documentation

## 2015-02-24 DIAGNOSIS — M5012 Cervical disc disorder with radiculopathy, mid-cervical region: Secondary | ICD-10-CM | POA: Insufficient documentation

## 2015-02-24 DIAGNOSIS — Z09 Encounter for follow-up examination after completed treatment for conditions other than malignant neoplasm: Secondary | ICD-10-CM

## 2015-02-24 DIAGNOSIS — Z419 Encounter for procedure for purposes other than remedying health state, unspecified: Secondary | ICD-10-CM

## 2015-02-24 DIAGNOSIS — I10 Essential (primary) hypertension: Secondary | ICD-10-CM | POA: Diagnosis not present

## 2015-02-24 HISTORY — PX: CERVICAL DISC ARTHROPLASTY: SHX587

## 2015-02-24 LAB — BASIC METABOLIC PANEL
Anion gap: 7 (ref 5–15)
BUN: 10 mg/dL (ref 6–20)
CHLORIDE: 107 mmol/L (ref 101–111)
CO2: 25 mmol/L (ref 22–32)
Calcium: 8.9 mg/dL (ref 8.9–10.3)
Creatinine, Ser: 0.79 mg/dL (ref 0.61–1.24)
GFR calc Af Amer: >60 mL/min (ref >60)
GFR calc non Af Amer: >60 mL/min (ref >60)
Glucose, Bld: 109 mg/dL — ABNORMAL HIGH (ref 65–99)
POTASSIUM: 4.1 mmol/L (ref 3.5–5.1)
SODIUM: 139 mmol/L (ref 135–145)

## 2015-02-24 SURGERY — CERVICAL ANTERIOR DISC ARTHROPLASTY
Anesthesia: General | Site: Neck

## 2015-02-24 MED ORDER — ONDANSETRON HCL 4 MG/2ML IJ SOLN
INTRAMUSCULAR | Status: DC | PRN
  Administered 2015-02-24: 4 mg via INTRAVENOUS

## 2015-02-24 MED ORDER — METHOCARBAMOL 500 MG PO TABS
500.0000 mg | ORAL_TABLET | Freq: Four times a day (QID) | ORAL | Status: DC | PRN
  Filled 2015-02-24: qty 1

## 2015-02-24 MED ORDER — HEMOSTATIC AGENTS (NO CHARGE) OPTIME
TOPICAL | Status: DC | PRN
  Administered 2015-02-24: 1 via TOPICAL

## 2015-02-24 MED ORDER — CEFAZOLIN SODIUM-DEXTROSE 2-3 GM-% IV SOLR
2.0000 g | INTRAVENOUS | Status: AC
  Administered 2015-02-24: 2 g via INTRAVENOUS
  Filled 2015-02-24: qty 50

## 2015-02-24 MED ORDER — PHENYLEPHRINE HCL 10 MG/ML IJ SOLN
INTRAMUSCULAR | Status: DC | PRN
  Administered 2015-02-24 (×2): 40 ug via INTRAVENOUS
  Administered 2015-02-24: 80 ug via INTRAVENOUS

## 2015-02-24 MED ORDER — GLYCOPYRROLATE 0.2 MG/ML IJ SOLN
INTRAMUSCULAR | Status: AC
  Filled 2015-02-24: qty 3

## 2015-02-24 MED ORDER — LIDOCAINE-EPINEPHRINE 1 %-1:100000 IJ SOLN
INTRAMUSCULAR | Status: DC | PRN
  Administered 2015-02-24: 2.5 mL

## 2015-02-24 MED ORDER — ONDANSETRON HCL 4 MG/2ML IJ SOLN: 4.0000 mg | INTRAMUSCULAR | Status: DC | PRN

## 2015-02-24 MED ORDER — PHENOL 1.4 % MT LIQD: 1.0000 | OROMUCOSAL | Status: DC | PRN

## 2015-02-24 MED ORDER — MIDAZOLAM HCL 2 MG/2ML IJ SOLN
INTRAMUSCULAR | Status: AC
  Administered 2015-02-24: 1 mg via INTRAVENOUS
  Filled 2015-02-24: qty 2

## 2015-02-24 MED ORDER — METHOCARBAMOL 1000 MG/10ML IJ SOLN
500.0000 mg | Freq: Four times a day (QID) | INTRAMUSCULAR | Status: DC | PRN
  Filled 2015-02-24: qty 5

## 2015-02-24 MED ORDER — SODIUM CHLORIDE 0.9 % IR SOLN
Status: DC | PRN
  Administered 2015-02-24: 13:00:00

## 2015-02-24 MED ORDER — HYDROMORPHONE HCL 1 MG/ML IJ SOLN
INTRAMUSCULAR | Status: AC
  Filled 2015-02-24: qty 1

## 2015-02-24 MED ORDER — MIDAZOLAM HCL 2 MG/2ML IJ SOLN
INTRAMUSCULAR | Status: AC
  Filled 2015-02-24: qty 2

## 2015-02-24 MED ORDER — ARTIFICIAL TEARS OP OINT
TOPICAL_OINTMENT | OPHTHALMIC | Status: AC
  Filled 2015-02-24: qty 3.5

## 2015-02-24 MED ORDER — MIDAZOLAM HCL 2 MG/2ML IJ SOLN
1.0000 mg | Freq: Once | INTRAMUSCULAR | Status: AC
  Administered 2015-02-24: 1 mg via INTRAVENOUS

## 2015-02-24 MED ORDER — MIDAZOLAM HCL 5 MG/5ML IJ SOLN
INTRAMUSCULAR | Status: DC | PRN
  Administered 2015-02-24: 2 mg via INTRAVENOUS

## 2015-02-24 MED ORDER — DEXAMETHASONE SODIUM PHOSPHATE 10 MG/ML IJ SOLN
INTRAMUSCULAR | Status: DC | PRN
  Administered 2015-02-24: 10 mg via INTRAVENOUS

## 2015-02-24 MED ORDER — LACTATED RINGERS IV SOLN
INTRAVENOUS | Status: DC | PRN
  Administered 2015-02-24 (×2): via INTRAVENOUS

## 2015-02-24 MED ORDER — 0.9 % SODIUM CHLORIDE (POUR BTL) OPTIME
TOPICAL | Status: DC | PRN
  Administered 2015-02-24: 1000 mL

## 2015-02-24 MED ORDER — THROMBIN 5000 UNITS EX SOLR
CUTANEOUS | Status: DC | PRN
  Administered 2015-02-24 (×2): 5000 [IU] via TOPICAL

## 2015-02-24 MED ORDER — DIAZEPAM 5 MG PO TABS: 5.0000 mg | ORAL_TABLET | Freq: Four times a day (QID) | ORAL | Status: DC | PRN

## 2015-02-24 MED ORDER — ATORVASTATIN CALCIUM 40 MG PO TABS
40.0000 mg | ORAL_TABLET | Freq: Every day | ORAL | Status: DC
  Administered 2015-02-24: 40 mg via ORAL
  Filled 2015-02-24: qty 1

## 2015-02-24 MED ORDER — FENTANYL CITRATE (PF) 100 MCG/2ML IJ SOLN
INTRAMUSCULAR | Status: DC | PRN
  Administered 2015-02-24: 250 ug via INTRAVENOUS

## 2015-02-24 MED ORDER — OXYCODONE-ACETAMINOPHEN 5-325 MG PO TABS: 1.0000 | ORAL_TABLET | ORAL | Status: DC | PRN

## 2015-02-24 MED ORDER — ROCURONIUM BROMIDE 100 MG/10ML IV SOLN
INTRAVENOUS | Status: DC | PRN
  Administered 2015-02-24: 50 mg via INTRAVENOUS

## 2015-02-24 MED ORDER — POLYETHYLENE GLYCOL 3350 17 G PO PACK
17.0000 g | PACK | Freq: Every day | ORAL | Status: DC | PRN
Start: 2015-02-24 — End: 2015-02-24
  Filled 2015-02-24: qty 1

## 2015-02-24 MED ORDER — ALUM & MAG HYDROXIDE-SIMETH 200-200-20 MG/5ML PO SUSP: 30.0000 mL | Freq: Four times a day (QID) | ORAL | Status: DC | PRN

## 2015-02-24 MED ORDER — DOCUSATE SODIUM 100 MG PO CAPS: 100.0000 mg | ORAL_CAPSULE | Freq: Two times a day (BID) | ORAL | Status: DC

## 2015-02-24 MED ORDER — SENNA 8.6 MG PO TABS: 1.0000 | ORAL_TABLET | Freq: Two times a day (BID) | ORAL | Status: DC

## 2015-02-24 MED ORDER — HYDROCODONE-ACETAMINOPHEN 5-325 MG PO TABS
1.0000 | ORAL_TABLET | ORAL | Status: DC | PRN
  Administered 2015-02-24: 1 via ORAL
  Filled 2015-02-24: qty 1

## 2015-02-24 MED ORDER — ACETAMINOPHEN 650 MG RE SUPP: 650.0000 mg | RECTAL | Status: DC | PRN

## 2015-02-24 MED ORDER — PROPOFOL 10 MG/ML IV BOLUS
INTRAVENOUS | Status: AC
  Filled 2015-02-24: qty 20

## 2015-02-24 MED ORDER — GLYCOPYRROLATE 0.2 MG/ML IJ SOLN
INTRAMUSCULAR | Status: DC | PRN
  Administered 2015-02-24: 0.6 mg via INTRAVENOUS

## 2015-02-24 MED ORDER — BUPIVACAINE HCL (PF) 0.5 % IJ SOLN
INTRAMUSCULAR | Status: DC | PRN
  Administered 2015-02-24: 2.5 mL

## 2015-02-24 MED ORDER — FENTANYL CITRATE (PF) 250 MCG/5ML IJ SOLN
INTRAMUSCULAR | Status: AC
  Filled 2015-02-24: qty 5

## 2015-02-24 MED ORDER — LACTATED RINGERS IV SOLN
INTRAVENOUS | Status: DC
  Administered 2015-02-24: 09:00:00 via INTRAVENOUS

## 2015-02-24 MED ORDER — SODIUM CHLORIDE 0.9 % IJ SOLN: 3.0000 mL | Freq: Two times a day (BID) | INTRAMUSCULAR | Status: DC

## 2015-02-24 MED ORDER — PROMETHAZINE HCL 25 MG/ML IJ SOLN: 6.2500 mg | INTRAMUSCULAR | Status: DC | PRN

## 2015-02-24 MED ORDER — DEXAMETHASONE SODIUM PHOSPHATE 10 MG/ML IJ SOLN
INTRAMUSCULAR | Status: AC
  Filled 2015-02-24: qty 1

## 2015-02-24 MED ORDER — MEPERIDINE HCL 25 MG/ML IJ SOLN: 6.2500 mg | INTRAMUSCULAR | Status: DC | PRN

## 2015-02-24 MED ORDER — ROCURONIUM BROMIDE 50 MG/5ML IV SOLN
INTRAVENOUS | Status: AC
  Filled 2015-02-24: qty 1

## 2015-02-24 MED ORDER — PROPOFOL 10 MG/ML IV BOLUS
INTRAVENOUS | Status: DC | PRN
  Administered 2015-02-24: 150 mg via INTRAVENOUS

## 2015-02-24 MED ORDER — HYDROMORPHONE HCL 1 MG/ML IJ SOLN: 0.5000 mg | INTRAMUSCULAR | Status: DC | PRN

## 2015-02-24 MED ORDER — ACETAMINOPHEN 325 MG PO TABS: 650.0000 mg | ORAL_TABLET | ORAL | Status: DC | PRN

## 2015-02-24 MED ORDER — MENTHOL 3 MG MT LOZG: 1.0000 | LOZENGE | OROMUCOSAL | Status: DC | PRN

## 2015-02-24 MED ORDER — CARVEDILOL 12.5 MG PO TABS
12.5000 mg | ORAL_TABLET | Freq: Two times a day (BID) | ORAL | Status: DC
  Administered 2015-02-24: 12.5 mg via ORAL
  Filled 2015-02-24 (×2): qty 1

## 2015-02-24 MED ORDER — KETOROLAC TROMETHAMINE 15 MG/ML IJ SOLN
15.0000 mg | Freq: Four times a day (QID) | INTRAMUSCULAR | Status: DC
  Administered 2015-02-24: 15 mg via INTRAVENOUS
  Filled 2015-02-24: qty 1

## 2015-02-24 MED ORDER — LIDOCAINE HCL (CARDIAC) 20 MG/ML IV SOLN
INTRAVENOUS | Status: DC | PRN
  Administered 2015-02-24: 40 mg via INTRAVENOUS
  Administered 2015-02-24: 60 mg via INTRATRACHEAL

## 2015-02-24 MED ORDER — ONDANSETRON HCL 4 MG/2ML IJ SOLN
INTRAMUSCULAR | Status: AC
  Filled 2015-02-24: qty 2

## 2015-02-24 MED ORDER — EPHEDRINE SULFATE 50 MG/ML IJ SOLN
INTRAMUSCULAR | Status: DC | PRN
  Administered 2015-02-24: 5 mg via INTRAVENOUS
  Administered 2015-02-24: 10 mg via INTRAVENOUS

## 2015-02-24 MED ORDER — MIDAZOLAM HCL 2 MG/2ML IJ SOLN: 0.5000 mg | Freq: Once | INTRAMUSCULAR | Status: DC | PRN

## 2015-02-24 MED ORDER — NEOSTIGMINE METHYLSULFATE 10 MG/10ML IV SOLN
INTRAVENOUS | Status: AC
  Filled 2015-02-24: qty 2

## 2015-02-24 MED ORDER — NEOSTIGMINE METHYLSULFATE 10 MG/10ML IV SOLN
INTRAVENOUS | Status: AC
Start: 2015-02-24 — End: 2015-02-24
  Filled 2015-02-24: qty 1

## 2015-02-24 MED ORDER — HYDROMORPHONE HCL 1 MG/ML IJ SOLN
0.2500 mg | INTRAMUSCULAR | Status: DC | PRN
  Administered 2015-02-24: 0.5 mg via INTRAVENOUS

## 2015-02-24 MED ORDER — NEOSTIGMINE METHYLSULFATE 10 MG/10ML IV SOLN
INTRAVENOUS | Status: DC | PRN
  Administered 2015-02-24: 4 mg via INTRAVENOUS

## 2015-02-24 MED ORDER — SODIUM CHLORIDE 0.9 % IJ SOLN: 3.0000 mL | INTRAMUSCULAR | Status: DC | PRN

## 2015-02-24 MED ORDER — AMLODIPINE BESYLATE 2.5 MG PO TABS: 2.5000 mg | ORAL_TABLET | Freq: Every day | ORAL | Status: DC

## 2015-02-24 SURGICAL SUPPLY — 60 items
BAG DECANTER FOR FLEXI CONT (MISCELLANEOUS) ×3 IMPLANT
BIT DRILL NEURO 2X3.1 SFT TUCH (MISCELLANEOUS) ×1 IMPLANT
BIT MILLING PRODISC 2.0 STER (BIT) ×3 IMPLANT
BNDG GAUZE ELAST 4 BULKY (GAUZE/BANDAGES/DRESSINGS) ×6 IMPLANT
BUR BARREL STRAIGHT FLUTE 4.0 (BURR) ×3 IMPLANT
CANISTER SUCT 3000ML PPV (MISCELLANEOUS) ×3 IMPLANT
CONT SPEC 4OZ CLIKSEAL STRL BL (MISCELLANEOUS) ×3 IMPLANT
DECANTER SPIKE VIAL GLASS SM (MISCELLANEOUS) ×3 IMPLANT
DERMABOND ADHESIVE PROPEN (GAUZE/BANDAGES/DRESSINGS) ×2
DERMABOND ADVANCED (GAUZE/BANDAGES/DRESSINGS)
DERMABOND ADVANCED .7 DNX12 (GAUZE/BANDAGES/DRESSINGS) IMPLANT
DERMABOND ADVANCED .7 DNX6 (GAUZE/BANDAGES/DRESSINGS) ×1 IMPLANT
DISC PRODISC-C MED DEEP 5MM (Neuro Prosthesis/Implant) ×3 IMPLANT
DRAPE C-ARM 42X72 X-RAY (DRAPES) ×6 IMPLANT
DRAPE LAPAROTOMY 100X72 PEDS (DRAPES) ×3 IMPLANT
DRAPE MICROSCOPE LEICA (MISCELLANEOUS) IMPLANT
DRAPE POUCH INSTRU U-SHP 10X18 (DRAPES) ×3 IMPLANT
DRILL NEURO 2X3.1 SOFT TOUCH (MISCELLANEOUS) ×3
DRSG OPSITE 4X5.5 SM (GAUZE/BANDAGES/DRESSINGS) ×3 IMPLANT
DRSG TELFA 3X8 NADH (GAUZE/BANDAGES/DRESSINGS) ×3 IMPLANT
DURAPREP 6ML APPLICATOR 50/CS (WOUND CARE) ×3 IMPLANT
ELECT REM PT RETURN 9FT ADLT (ELECTROSURGICAL) ×3
ELECTRODE REM PT RTRN 9FT ADLT (ELECTROSURGICAL) ×1 IMPLANT
GAUZE SPONGE 4X4 16PLY XRAY LF (GAUZE/BANDAGES/DRESSINGS) IMPLANT
GLOVE BIO SURGEON STRL SZ7.5 (GLOVE) IMPLANT
GLOVE BIOGEL PI IND STRL 7.5 (GLOVE) ×1 IMPLANT
GLOVE BIOGEL PI IND STRL 8.5 (GLOVE) ×1 IMPLANT
GLOVE BIOGEL PI INDICATOR 7.5 (GLOVE) ×2
GLOVE BIOGEL PI INDICATOR 8.5 (GLOVE) ×2
GLOVE ECLIPSE 8.5 STRL (GLOVE) ×3 IMPLANT
GLOVE ECLIPSE 9.0 STRL (GLOVE) ×3 IMPLANT
GLOVE EXAM NITRILE LRG STRL (GLOVE) IMPLANT
GLOVE EXAM NITRILE MD LF STRL (GLOVE) IMPLANT
GLOVE EXAM NITRILE XL STR (GLOVE) IMPLANT
GLOVE EXAM NITRILE XS STR PU (GLOVE) IMPLANT
GLOVE SURG SS PI 7.0 STRL IVOR (GLOVE) ×9 IMPLANT
GOWN STRL REUS W/ TWL LRG LVL3 (GOWN DISPOSABLE) IMPLANT
GOWN STRL REUS W/ TWL XL LVL3 (GOWN DISPOSABLE) ×2 IMPLANT
GOWN STRL REUS W/TWL 2XL LVL3 (GOWN DISPOSABLE) ×3 IMPLANT
GOWN STRL REUS W/TWL LRG LVL3 (GOWN DISPOSABLE)
GOWN STRL REUS W/TWL XL LVL3 (GOWN DISPOSABLE) ×4
HALTER HD/CHIN CERV TRACTION D (MISCELLANEOUS) IMPLANT
KIT BASIN OR (CUSTOM PROCEDURE TRAY) ×3 IMPLANT
KIT ROOM TURNOVER OR (KITS) ×3 IMPLANT
NEEDLE HYPO 22GX1.5 SAFETY (NEEDLE) ×3 IMPLANT
NEEDLE SPNL 22GX3.5 QUINCKE BK (NEEDLE) ×3 IMPLANT
NS IRRIG 1000ML POUR BTL (IV SOLUTION) ×3 IMPLANT
PACK LAMINECTOMY NEURO (CUSTOM PROCEDURE TRAY) ×3 IMPLANT
PAD ARMBOARD 7.5X6 YLW CONV (MISCELLANEOUS) ×6 IMPLANT
PIN RETAINER PRODISC 14 MM (PIN) ×3 IMPLANT
ProDisc 14 mm screw ×6 IMPLANT
RUBBERBAND STERILE (MISCELLANEOUS) IMPLANT
SPONGE INTESTINAL PEANUT (DISPOSABLE) ×3 IMPLANT
SPONGE SURGIFOAM ABS GEL SZ50 (HEMOSTASIS) ×3 IMPLANT
SUT VIC AB 3-0 SH 8-18 (SUTURE) ×3 IMPLANT
SYR 20ML ECCENTRIC (SYRINGE) IMPLANT
TIP INSERTER MEDIUM (INSTRUMENTS) ×3 IMPLANT
TOWEL OR 17X24 6PK STRL BLUE (TOWEL DISPOSABLE) ×3 IMPLANT
TOWEL OR 17X26 10 PK STRL BLUE (TOWEL DISPOSABLE) ×3 IMPLANT
WATER STERILE IRR 1000ML POUR (IV SOLUTION) ×3 IMPLANT

## 2015-02-24 NOTE — Anesthesia Postprocedure Evaluation (Signed)
  Anesthesia Post-op Note  Patient: [name]  Procedure(s) Performed: Procedure(s) with comments: Cervical five-six Artificial disc replacement (N/A) - C5-6 Artificial disc replacement  Patient Location: PACU  Anesthesia Type:General  Level of Consciousness: awake, alert , oriented and patient cooperative  Airway and Oxygen Therapy: Patient Spontanous Breathing  Post-op Pain: none  Post-op Assessment: Post-op Vital signs reviewed, Patient's Cardiovascular Status Stable, Respiratory Function Stable, Patent Airway, No signs of Nausea or vomiting and Pain level controlled              Post-op Vital Signs: Reviewed and stable  Last Vitals:  Filed Vitals:   02/24/15 1500  BP: 117/70  Pulse: 66  Temp:   Resp: 14    Complications: No apparent anesthesia complications

## 2015-02-24 NOTE — Anesthesia Preprocedure Evaluation (Addendum)
Anesthesia Evaluation  Patient identified by MRN, date of birth, ID band Patient awake    Reviewed: Allergy & Precautions, NPO status , Patient's Chart, lab work & pertinent test results  History of Anesthesia Complications (+) history of anesthetic complications (h/o post obstr pulm edema with LMA)  Airway Mallampati: I  TM Distance: >3 FB Neck ROM: Full    Dental  (+) Dental Advisory Given   Pulmonary neg pulmonary ROS,  breath sounds clear to auscultation        Cardiovascular hypertension, Pt. on medications and Pt. on home beta blockers - anginaRhythm:Regular Rate:Normal     Neuro/Psych Cervical neck pain    GI/Hepatic negative GI ROS, Neg liver ROS,   Endo/Other  negative endocrine ROS  Renal/GU negative Renal ROS     Musculoskeletal   Abdominal   Peds  Hematology negative hematology ROS (+)   Anesthesia Other Findings   Reproductive/Obstetrics                            Anesthesia Physical Anesthesia Plan  ASA: II  Anesthesia Plan: General   Post-op Pain Management:    Induction: Intravenous  Airway Management Planned: Oral ETT  Additional Equipment:   Intra-op Plan:   Post-operative Plan: Extubation in OR  Informed Consent: I have reviewed the patients History and Physical, chart, labs and discussed the procedure including the risks, benefits and alternatives for the proposed anesthesia with the patient or authorized representative who has indicated his/her understanding and acceptance.   Dental advisory given  Plan Discussed with: Surgeon and CRNA  Anesthesia Plan Comments: (Plan routine monitors, GETA)        Anesthesia Quick Evaluation

## 2015-02-24 NOTE — Discharge Summary (Signed)
Physician Discharge Summary  Patient ID: [name]: [id number] DOB/AGE: 51/10/1960 52 y.o.  Admit date: 02/24/2015 Discharge date: 02/24/2015  Admission Diagnoses:Herniated Nucleus Pulposis C5-6  Discharge Diagnoses: Herniated nucleus pulposis C5-6 Active Problems:   Cervical radiculopathy   Discharged Condition: good  Hospital Course: Tolerated Surgery well2  Consults: None  Significant Diagnostic Studies: none  Treatments: surgery: Anterior Cervical discectomy and Arthroplasty C5-6  Discharge Exam: Blood pressure 143/90, pulse 80, temperature 97.7 F (36.5 C), temperature source Oral, resp. rate 18, height 5\' 5"  (1.651 m), weight 79.379 kg (175 lb), SpO2 100 %. incision clean and dry motor function intact. Radicular pain is gone  Disposition: 01-Home or Self Care  Discharge Instructions    Call MD for:  redness, tenderness, or signs of infection (pain, swelling, redness, odor or green/yellow discharge around incision site)    Complete by:  As directed      Call MD for:  severe uncontrolled pain    Complete by:  As directed      Call MD for:  temperature >100.4    Complete by:  As directed      Diet - low sodium heart healthy    Complete by:  As directed      Discharge instructions    Complete by:  As directed   Okay to shower. Do not apply salves or appointments to incision. No heavy lifting with the upper extremities greater than 15 pounds. May resume driving when not requiring pain medication and patient feels comfortable with doing so.     Increase activity slowly    Complete by:  As directed             Medication List    TAKE these medications        acetaminophen 500 MG tablet  Commonly known as:  TYLENOL  Take 1,000 mg by mouth every 4 (four) hours as needed for mild pain.     amLODipine 2.5 MG tablet  Commonly known as:  NORVASC  Take 2.5 mg by mouth daily.     atorvastatin 40 MG tablet  Commonly known as:  LIPITOR  Take 40 mg by mouth  daily.     carvedilol 25 MG tablet  Commonly known as:  COREG  Take 12.5 mg by mouth 2 (two) times daily with a meal.     diazepam 5 MG tablet  Commonly known as:  VALIUM  Take 1 tablet (5 mg total) by mouth every 6 (six) hours as needed for anxiety.     ibuprofen 600 MG tablet  Commonly known as:  ADVIL,MOTRIN  Take 1 tablet (600 mg total) by mouth once.     multivitamin with minerals Tabs tablet  Take 1 tablet by mouth daily.     oxyCODONE-acetaminophen 5-325 MG per tablet  Commonly known as:  PERCOCET/ROXICET  Take 1-2 tablets by mouth every 4 (four) hours as needed for moderate pain.     Vitamin D (Ergocalciferol) 50000 UNITS Caps capsule  Commonly known as:  DRISDOL  Take 50,000 Units by mouth 2 (two) times a week. *takes on mondays and fridays*         Signed: Stefani DamaLSNER,Alireza Pollack J 02/24/2015, 6:39 PM

## 2015-02-24 NOTE — Progress Notes (Signed)
Discharge instructions/education/Rx given to patient with wife at bedside and they both verbalized understanding. No redness, no swelling and no drainage noted on incision site, pain is mild per patient.

## 2015-02-24 NOTE — Anesthesia Procedure Notes (Signed)
Procedure Name: Intubation Date/Time: 02/24/2015 11:50 AM Performed by: Margaree MackintoshYACOUB, Kadie Balestrieri B Pre-anesthesia Checklist: Patient being monitored, Patient identified, Emergency Drugs available, Suction available and Timeout performed Patient Re-evaluated:Patient Re-evaluated prior to inductionOxygen Delivery Method: Circle system utilized Preoxygenation: Pre-oxygenation with 100% oxygen Intubation Type: IV induction Ventilation: Mask ventilation without difficulty and Oral airway inserted - appropriate to patient size Laryngoscope Size: Mac and 4 Grade View: Grade III Tube type: Oral Tube size: 7.5 mm Number of attempts: 1 Airway Equipment and Method: Bougie stylet Placement Confirmation: ETT inserted through vocal cords under direct vision,  positive ETCO2 and breath sounds checked- equal and bilateral Secured at: 22 cm Tube secured with: Tape Dental Injury: Teeth and Oropharynx as per pre-operative assessment

## 2015-02-24 NOTE — Op Note (Signed)
Date of surgery: 02/24/2015 Preoperative diagnosis: Herniated nucleus pulposus C5-6 with cervical radiculopathy on the right Postoperative diagnosis: Herniated nucleus pulposus C5-6 with cervical radiculopathy on the right Procedure: Anterior cervical decompression C5-C6 and arthroplasty with Cynthia's Prodisc cervical 5 mm tall medium width medium depth prosthesis Surgeon: Barnett AbuHenry Allysson Rinehimer M.D. First assistant: Karene FryAndy Poole M.D. Anesthesia: Gen. endotracheal Indications: Mr. Durenda Guthrienthony Dunnavant is a 54 year old individual who's had a significant right cervical radiculopathy. He has evidence of a herniated nucleus pulposus at C5-C6. He's been advised regarding the need for surgical decompression because he has minimal spondylitic changes and arthroplasty has been advised. He is now being admitted to undergo this procedure.  Procedure: The patient was brought to the operating room supine on a stretcher. After the smooth induction of general endotracheal anesthesia, he was placed in a horseshoe head rest. Fluoroscopic guidance was then used to obtain confirmatory imaging of the level of C5-C6. Anterior border of the neck was prepped with alcohol and DuraPrep and draped in a sterile fashion. Transverse incision was made on the left side and carried down through the platysma. The plane between the sternocleidomastoid and strap muscles was dissected bluntly until the first prevertebral space was reached. The first identifiable disc space was confirmed radiographically at C5-C6. Then the midline was identified the longus coli muscles were identified and guidepins were then placed into the vertebral bodies of C5 and C6. A slight bit of distraction was placed on the guidepins. Anterior longitudinal ligament was then opened at C5-C6 combination of curettes and rongeurs and high-speed drill was used to remove some ventral osteophytosis in addition to the bulk of the degenerated disc material. Then as the region of the posterior  longitudinal ligament was unclosed the dissection was carried out to the right side and in the subligamentous space was found to be significant herniation of disc material in the region of the foramen. This was removed with a 1 and 2 mm Kerrison punch. Dissection was carried out to the lateral recess to make sure that the C6 nerve root was well decompressed. A similar dissection was carried out to the left side. In the end the endplates were rongeured smooth. Then a trial was placed into the interspace and ultimately was felt that a 5 mm tall medium with an medium depth implant would fit best. Troughs were then cut into the vertebral endplates using the guide tool and fluoroscopy to verify that the troughs were cut adequately. Once the troughs were cut there is an clean the trial was removed and the implant was then tamped into position under fluoroscopic guidance. He was verified visually has been in good position between the endplates. The distraction pins were then removed. Hemostasis from the bleeding edges was obtained with the bipolar cautery pledgets of Gelfoam soaked in thrombin that were then packed into and around the bleeding bone. The area was inspected carefully no residual disc material was identified the foramen were amply distracted and open and with this hemostasis and the soft tissues was verified and the platysma was closed with 3 0 Vicryls in interrupted fashion and 3 0 Vicryls used in the subcuticular tissues. Blood loss is estimated at 25 mL.

## 2015-02-24 NOTE — Progress Notes (Signed)
Patient ID: [name], male   DOB: 09/23/1960, 54 y.o.   MRN: [id number] Comfortable, doing well postoperatively Motor function intact Will obtain postoperative AP and lateral radiograph.

## 2015-02-24 NOTE — Progress Notes (Signed)
Upon arrival to [facility], vital signs were obtained and patients DBP is above 110.  He has taken his Norvasc & Coreg as instructed this AM.   He denies any real pain, has visited the restroom and appears relaxed.  Have taken BP's in both arms.  The right seems 10 "pts" higher SBP then left.  At this moment, new orders given by Dr. Sandford Craze Jackson, and carried out.  IV versed given at 10:08. 10:25  Bp taken in left arm -  127/91     Dr Jean RosenthalJackson made aware.

## 2015-02-24 NOTE — Plan of Care (Signed)
Problem: Consults Goal: Diagnosis - Spinal Surgery Outcome: Completed/Met Date Met:  02/24/15 C5-C6 ARTIFICIAL DISC REPLACEMENT

## 2015-02-24 NOTE — H&P (Signed)
CHIEF COMPLAINT:                                          A bulging disc with a pinched nerve, and numbness in the right hand.  HISTORY OF PRESENT ILLNESS:                     [name] is a 54 year old right and left handed individual who has been having problems with pain in the right arm and right hand, with numbness into the fingertips since 08/2014.  He had an episode back in 03/2014 where he had some substantial neck pain, but since January, he has been suffering with significant numbness in the hands, particularly when he holds in any given position for any length of time.  He also notes that certain activities such as reaching across to open to open his glove box or reaching above his arm would aggravate symptoms for him.  He also has become aware of some weakness in that right upper extremity.  He has been seen and evaluated by Su MonksKaren Prueter and he is referred here via her courtesy.  An MRI was performed and this demonstrates presence of a chronic disc herniation with some osteophytic overgrowth on the right side of the level C5-6.  He initially had had some physical therapy for his cervical spine, and this did not seem to give him much relief.  He has been requiring some pain medication and currently is using Hydrocodone on occasion when the pain is severe.  REVIEW OF SYSTEMS:                                    Systems reviewed is notable for some hearing loss, wearing of glasses, ringing in the ears, high blood pressure, arm weakness, back pain, arm pain, neck pain, and difficulty with concentration on a 14 point review sheet.   PAST MEDICAL HISTORY:                                His past medical history reveals that his health otherwise is good.  He does have some hypertension.  . Prior Operations:  The only other surgery has been some cyst removal in 04/2012.  . Medications and Allergies:  Current medications include Carvedilol, amlodipine, fish oil, gabapentin, and Hydrocodone.     SOCIAL HISTORY:                                            Personal history reveals that he does not smoke or use alcohol.   Height and weight have been stable at 65 inches in height, 170 pounds.  PHYSICAL EXAMINATION:                                On physical examination I note that there is no tenderness and no mass noted in the supraclavicular fossa.  He does have some modest weakness in the deltoid and the bicep on the right side.  The wrist extensor is intact, as are the finger extensors and intrinsic muscles  of the hand.  His triceps strength is intact.  His deep tendon reflexes are sound in both the biceps, triceps, brachioradialis, and the patellae and the Achilles bolt.    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  I demonstrated the findings to him.  Today in the office to further his workup, I obtained a lateral flexion and extension film of the cervical spine, along with a singular AP view.  This demonstrates that the alignment of his neck is maintained fairly neutral throughout flexion and extension without any listhesis.  There are no significant bony spurs that are apparent either ventrally or dorsally around the C5-6 level, although there is modest in loss of disc height.    PLAN:                                                               Given the findings on his plain x-rays, along with the MRI findings, and the patient's symptoms, I have advised that he undergo an anterior cervical decompression with a disc arthroplasty.  I believe that this would help maintain the mobility of his neck, and hopefully lessen the chances for him developing any adjacent level problems.  I believe that he should do well with this and have the nerve decompressed as I am most concerned that he is evolving weakness in that right arm.  I don't believe that further efforts of conservative management are likely  to yield much improvement in symptoms overall.  I discussed the surgical intervention with him and his wife noting the major risks including the potential for injuries even to the esophagus, spinal cord, or the great vessels.  These things not withstanding, I believe that he should do well to undergo surgical decompressions and arthroplasty. Marland Kitchen

## 2015-02-24 NOTE — Transfer of Care (Signed)
Immediate Anesthesia Transfer of Care Note  Patient: [name]  Procedure(s) Performed: Procedure(s) with comments: Cervical five-six Artificial disc replacement (N/A) - C5-6 Artificial disc replacement  Patient Location: PACU  Anesthesia Type:General  Level of Consciousness: awake and alert   Airway & Oxygen Therapy: Patient Spontanous Breathing and Patient connected to nasal cannula oxygen  Post-op Assessment: Report given to RN and Post -op Vital signs reviewed and stable  Post vital signs: Reviewed and stable  Last Vitals:  Filed Vitals:   02/24/15 1020  BP: 127/91  Pulse: 70  Temp:   Resp: 16    Complications: No apparent anesthesia complications

## 2015-02-25 ENCOUNTER — Encounter (HOSPITAL_COMMUNITY): Payer: Self-pay | Admitting: Neurological Surgery

## 2015-03-03 ENCOUNTER — Encounter (HOSPITAL_COMMUNITY): Payer: Self-pay | Admitting: Neurological Surgery

## 2015-03-05 ENCOUNTER — Encounter (HOSPITAL_COMMUNITY): Payer: Self-pay | Admitting: Neurological Surgery

## 2015-10-24 ENCOUNTER — Encounter (HOSPITAL_COMMUNITY): Payer: Self-pay | Admitting: *Deleted

## 2015-10-24 ENCOUNTER — Emergency Department (HOSPITAL_COMMUNITY)
Admission: EM | Admit: 2015-10-24 | Discharge: 2015-10-24 | Disposition: A | Discharge status: Home / Self Care | Attending: Emergency Medicine | Admitting: Emergency Medicine

## 2015-10-24 DIAGNOSIS — M549 Dorsalgia, unspecified: Secondary | ICD-10-CM | POA: Insufficient documentation

## 2015-10-24 DIAGNOSIS — R202 Paresthesia of skin: Secondary | ICD-10-CM | POA: Insufficient documentation

## 2015-10-24 DIAGNOSIS — H9201 Otalgia, right ear: Secondary | ICD-10-CM

## 2015-10-24 DIAGNOSIS — M1990 Unspecified osteoarthritis, unspecified site: Secondary | ICD-10-CM | POA: Diagnosis not present

## 2015-10-24 DIAGNOSIS — K002 Abnormalities of size and form of teeth: Secondary | ICD-10-CM | POA: Diagnosis not present

## 2015-10-24 DIAGNOSIS — K0889 Other specified disorders of teeth and supporting structures: Secondary | ICD-10-CM | POA: Insufficient documentation

## 2015-10-24 DIAGNOSIS — Z79899 Other long term (current) drug therapy: Secondary | ICD-10-CM | POA: Insufficient documentation

## 2015-10-24 DIAGNOSIS — I10 Essential (primary) hypertension: Secondary | ICD-10-CM | POA: Diagnosis not present

## 2015-10-24 DIAGNOSIS — Z791 Long term (current) use of non-steroidal anti-inflammatories (NSAID): Secondary | ICD-10-CM | POA: Insufficient documentation

## 2015-10-24 DIAGNOSIS — G8929 Other chronic pain: Secondary | ICD-10-CM | POA: Diagnosis not present

## 2015-10-24 MED ORDER — PENICILLIN V POTASSIUM 500 MG PO TABS: 500.0000 mg | ORAL_TABLET | Freq: Four times a day (QID) | ORAL | Status: DC

## 2015-10-24 NOTE — Discharge Instructions (Signed)
Back Pain, Adult °Back pain is very common in adults. The cause of back pain is rarely dangerous and the pain often gets better over time. The cause of your back pain may not be known. Some common causes of back pain include: °· Strain of the muscles or ligaments supporting the spine. °· Wear and tear (degeneration) of the spinal disks. °· Arthritis. °· Direct injury to the back. °For many people, back pain may return. Since back pain is rarely dangerous, most people can learn to manage this condition on their own. °HOME CARE INSTRUCTIONS °Watch your back pain for any changes. The following actions may help to lessen any discomfort you are feeling: °· Remain active. It is stressful on your back to sit or stand in one place for long periods of time. Do not sit, drive, or stand in one place for more than 30 minutes at a time. Take short walks on even surfaces as soon as you are able. Try to increase the length of time you walk each day. °· Exercise regularly as directed by your health care provider. Exercise helps your back heal faster. It also helps avoid future injury by keeping your muscles strong and flexible. °· Do not stay in bed. Resting more than 1-2 days can delay your recovery. °· Pay attention to your body when you bend and lift. The most comfortable positions are those that put less stress on your recovering back. Always use proper lifting techniques, including: °¨ Bending your knees. °¨ Keeping the load close to your body. °¨ Avoiding twisting. °· Find a comfortable position to sleep. Use a firm mattress and lie on your side with your knees slightly bent. If you lie on your back, put a pillow under your knees. °· Avoid feeling anxious or stressed. Stress increases muscle tension and can worsen back pain. It is important to recognize when you are anxious or stressed and learn ways to manage it, such as with exercise. °· Take medicines only as directed by your health care provider. Over-the-counter  medicines to reduce pain and inflammation are often the most helpful. Your health care provider may prescribe muscle relaxant drugs. These medicines help dull your pain so you can more quickly return to your normal activities and healthy exercise. °· Apply ice to the injured area: °¨ Put ice in a plastic bag. °¨ Place a towel between your skin and the bag. °¨ Leave the ice on for 20 minutes, 2-3 times a day for the first 2-3 days. After that, ice and heat may be alternated to reduce pain and spasms. °· Maintain a healthy weight. Excess weight puts extra stress on your back and makes it difficult to maintain good posture. °SEEK MEDICAL CARE IF: °· You have pain that is not relieved with rest or medicine. °· You have increasing pain going down into the legs or buttocks. °· You have pain that does not improve in one week. °· You have night pain. °· You lose weight. °· You have a fever or chills. °SEEK IMMEDIATE MEDICAL CARE IF:  °· You develop new bowel or bladder control problems. °· You have unusual weakness or numbness in your arms or legs. °· You develop nausea or vomiting. °· You develop abdominal pain. °· You feel faint. °  °This information is not intended to replace advice given to you by your health care provider. Make sure you discuss any questions you have with your health care provider. °  °Document Released: 07/26/2005 Document Revised: 08/16/2014 Document Reviewed: 11/27/2013 °Elsevier Interactive Patient Education ©2016 Elsevier   Inc. Dental Pain Dental pain may be caused by many things, including:  Tooth decay (cavities or caries). Cavities expose the nerve of your tooth to air and hot or cold temperatures. This can cause pain or discomfort.  Abscess or infection. A dental abscess is a collection of infected pus from a bacterial infection in the inner part of the tooth (pulp). It usually occurs at the end of the tooth's root.  Injury.  An unknown reason (idiopathic). Your pain may be mild or  severe. It may only occur when:  You are chewing.  You are exposed to hot or cold temperature.  You are eating or drinking sugary foods or beverages, such as soda or candy. Your pain may also be constant. HOME CARE INSTRUCTIONS Watch your dental pain for any changes. The following actions may help to lessen any discomfort that you are feeling:  Take medicines only as directed by your dentist.  If you were prescribed an antibiotic medicine, finish all of it even if you start to feel better.  Keep all follow-up visits as directed by your dentist. This is important.  Do not apply heat to the outside of your face.  Rinse your mouth or gargle with salt water if directed by your dentist. This helps with pain and swelling.  You can make salt water by adding  tsp of salt to 1 cup of warm water.  Apply ice to the painful area of your face:  Put ice in a plastic bag.  Place a towel between your skin and the bag.  Leave the ice on for 20 minutes, 2-3 times per day.  Avoid foods or drinks that cause you pain, such as:  Very hot or very cold foods or drinks.  Sweet or sugary foods or drinks. SEEK MEDICAL CARE IF:  Your pain is not controlled with medicines.  Your symptoms are worse.  You have new symptoms. SEEK IMMEDIATE MEDICAL CARE IF:  You are unable to open your mouth.  You are having trouble breathing or swallowing.  You have a fever.  Your face, neck, or jaw is swollen.   This information is not intended to replace advice given to you by your health care provider. Make sure you discuss any questions you have with your health care provider.   Document Released: 07/26/2005 Document Revised: 12/10/2014 Document Reviewed: 07/22/2014 Elsevier Interactive Patient Education [employer]2016 Elsevier Inc.

## 2015-10-24 NOTE — ED Notes (Signed)
Patient able to ambulate independently  

## 2015-10-24 NOTE — ED Provider Notes (Signed)
CSN: [id number]     Arrival date & time 10/24/15  1818 History  By signing my name below, I, Bethel Born, attest that this documentation has been prepared under the direction and in the presence of [employer]. Electronically Signed: Bethel Born, ED Scribe. 10/24/2015 7:09 PM  Chief Complaint  Patient presents with  . Otalgia  . Back Pain   The history is provided by the patient. No language interpreter was used.   [name] is a 55 y.o. male who presents to the Emergency Department complaining of constant, 8/10 in severity, right ear pain with onset 2 weeks ago after dental work. Motrin has provided insufficient relief in pain at home.  His dentist is Dr. Oswaldo Done. Pt has NKDA.   He also complains of chronic right sided back pain since having surgery last year. The pain is exacerbated by certain movements and he has tingling in his small fingers after sitting in hard chairs.   Past Medical History  Diagnosis Date  . Hypertension   . GERD (gastroesophageal reflux disease)   . Arthritis   . Complication of anesthesia     admit with post-op pneumonitis 04/21/11 (LMA had become dislodged during surgery earlier that day)  . Difficult intubation     ? difficult intubation per pt 04/21/11 ([facility]); however, notes indicate LMA disloged and unsuccesfful attempt to replace-->admitted with post-op pneumonitis   Past Surgical History  Procedure Laterality Date  . Wrist surgery    . Vasectomy    . Cervical disc arthroplasty N/A 02/24/2015    Procedure: Cervical five-six Artificial disc replacement;  Surgeon: Barnett Abu, MD;  Location: [facility];  Service: Neurosurgery;  Laterality: N/A;  C5-6 Artificial disc replacement   History reviewed. No pertinent family history. Social History  Substance Use Topics  . Smoking status: Never Smoker   . Smokeless tobacco: Former Neurosurgeon  . Alcohol Use: No    Review of Systems  HENT: Positive for ear pain.   Musculoskeletal:  Positive for back pain.  Neurological:       Tingling in the small fingers   Allergies  Review of patient's allergies indicates no known allergies.  Home Medications   Prior to Admission medications   Medication Sig Start Date End Date Taking? Authorizing Provider  acetaminophen (TYLENOL) 500 MG tablet Take 1,000 mg by mouth every 4 (four) hours as needed for mild pain.    Historical Provider, MD  amLODipine (NORVASC) 2.5 MG tablet Take 2.5 mg by mouth daily.    Historical Provider, MD  atorvastatin (LIPITOR) 40 MG tablet Take 40 mg by mouth daily.    Historical Provider, MD  carvedilol (COREG) 25 MG tablet Take 12.5 mg by mouth 2 (two) times daily with a meal.    Historical Provider, MD  diazepam (VALIUM) 5 MG tablet Take 1 tablet (5 mg total) by mouth every 6 (six) hours as needed for anxiety. 02/24/15   Barnett Abu, MD  ibuprofen (ADVIL,MOTRIN) 600 MG tablet Take 1 tablet (600 mg total) by mouth once. 06/24/13   Earley Favor, NP  Multiple Vitamin (MULTIVITAMIN WITH MINERALS) TABS Take 1 tablet by mouth daily.    Historical Provider, MD  oxyCODONE-acetaminophen (PERCOCET/ROXICET) 5-325 MG per tablet Take 1-2 tablets by mouth every 4 (four) hours as needed for moderate pain. 02/24/15   Barnett Abu, MD  Vitamin D, Ergocalciferol, (DRISDOL) 50000 UNITS CAPS capsule Take 50,000 Units by mouth 2 (two) times a week. *takes on mondays and fridays*  Historical Provider, MD   BP 143/96 mmHg  Pulse 68  Temp(Src) 98.1 F (36.7 C) (Oral)  Resp 18  SpO2 97% Physical Exam  Constitutional: He is oriented to person, place, and time. He appears well-developed and well-nourished. No distress.  HENT:  Head: Normocephalic and atraumatic.  Poor dentition throughout.  Right lower rear molars. No signs of peritonsillar or tonsillar abscess.  No signs of gingival abscess. Oropharynx is clear and without exudates.  Uvula is midline.  Airway is intact. No signs of Ludwig's angina with palpation of oral and  sublingual mucosa.   Eyes: Conjunctivae and EOM are normal. Right eye exhibits no discharge. Left eye exhibits no discharge. No scleral icterus.  Neck: Normal range of motion. Neck supple. No tracheal deviation present.  Cardiovascular: Normal rate, regular rhythm and normal heart sounds.  Exam reveals no gallop and no friction rub.   No murmur heard. Pulmonary/Chest: Effort normal and breath sounds normal. No respiratory distress. He has no wheezes.  Abdominal: Soft. He exhibits no distension. There is no tenderness.  Musculoskeletal: Normal range of motion.  No focal paraspinal muscles tenderness to palpation, no bony tenderness, step-offs, or gross abnormality or deformity of spine, patient is able to ambulate, moves all extremities  Bilateral great toe extension intact Bilateral plantar/dorsiflexion intact  Neurological: He is alert and oriented to person, place, and time. He has normal reflexes.  Sensation and strength intact bilaterally Symmetrical reflexes  Skin: Skin is warm. He is not diaphoretic.  Psychiatric: He has a normal mood and affect. His behavior is normal. Judgment and thought content normal.  Nursing note and vitals reviewed.   ED Course  Procedures (including critical care time) DIAGNOSTIC STUDIES: Oxygen Saturation is 97% on RA,  normal by my interpretation.    COORDINATION OF CARE: 7:06 PM Discussed treatment plan which includes discharge with abx with pt at bedside and pt agreed to plan.    MDM   Final diagnoses:  Pain, dental  Otalgia of right ear  Chronic back pain    Patient with toothache.  No gross abscess.  Exam unconcerning for Ludwig's angina or spread of infection.  Will treat with penicillin and OTC pain medicine.  Urged patient to follow-up with dentist.    Patient with back pain.  No neurological deficits and normal neuro exam.  Patient is ambulatory.  No loss of bowel or bladder control.  Doubt cauda equina.  Denies fever,  doubt epidural  abscess or other lesion. Recommend back exercises, stretching, RICE.  Encouraged the patient that there could be a need for additional workup and/or imaging such as MRI, if the symptoms do not resolve. Patient advised that if the back pain does not resolve, or radiates, this could progress to more serious conditions and is encouraged to follow-up with PCP or orthopedics within 2 weeks.     I personally performed the services described in this documentation, which was scribed in my presence. The recorded information has been reviewed and is accurate.      Roxy Horsemanobert Clotile Whittington, PA-C 10/24/15 1912  Raeford RazorStephen Kohut, MD 10/25/15 [phone]

## 2015-10-24 NOTE — ED Notes (Signed)
Pt reports having right ear pain x 2 weeks. Also having right side back pain, but states that is an ongoing problem, hx of same, increases with movement. Denies any urinary symptoms. Ambulatory at triage.,

## 2015-11-02 IMAGING — DX DG CERVICAL SPINE 2 OR 3 VIEWS
2 series · 2 of 2 positions shown · non-contrast
Comparison: Intraoperative images 02/24/2015

CLINICAL DATA: C5-C6 artificial displacement

EXAM:
CERVICAL SPINE - 2-3 VIEW

[c-spine lat]
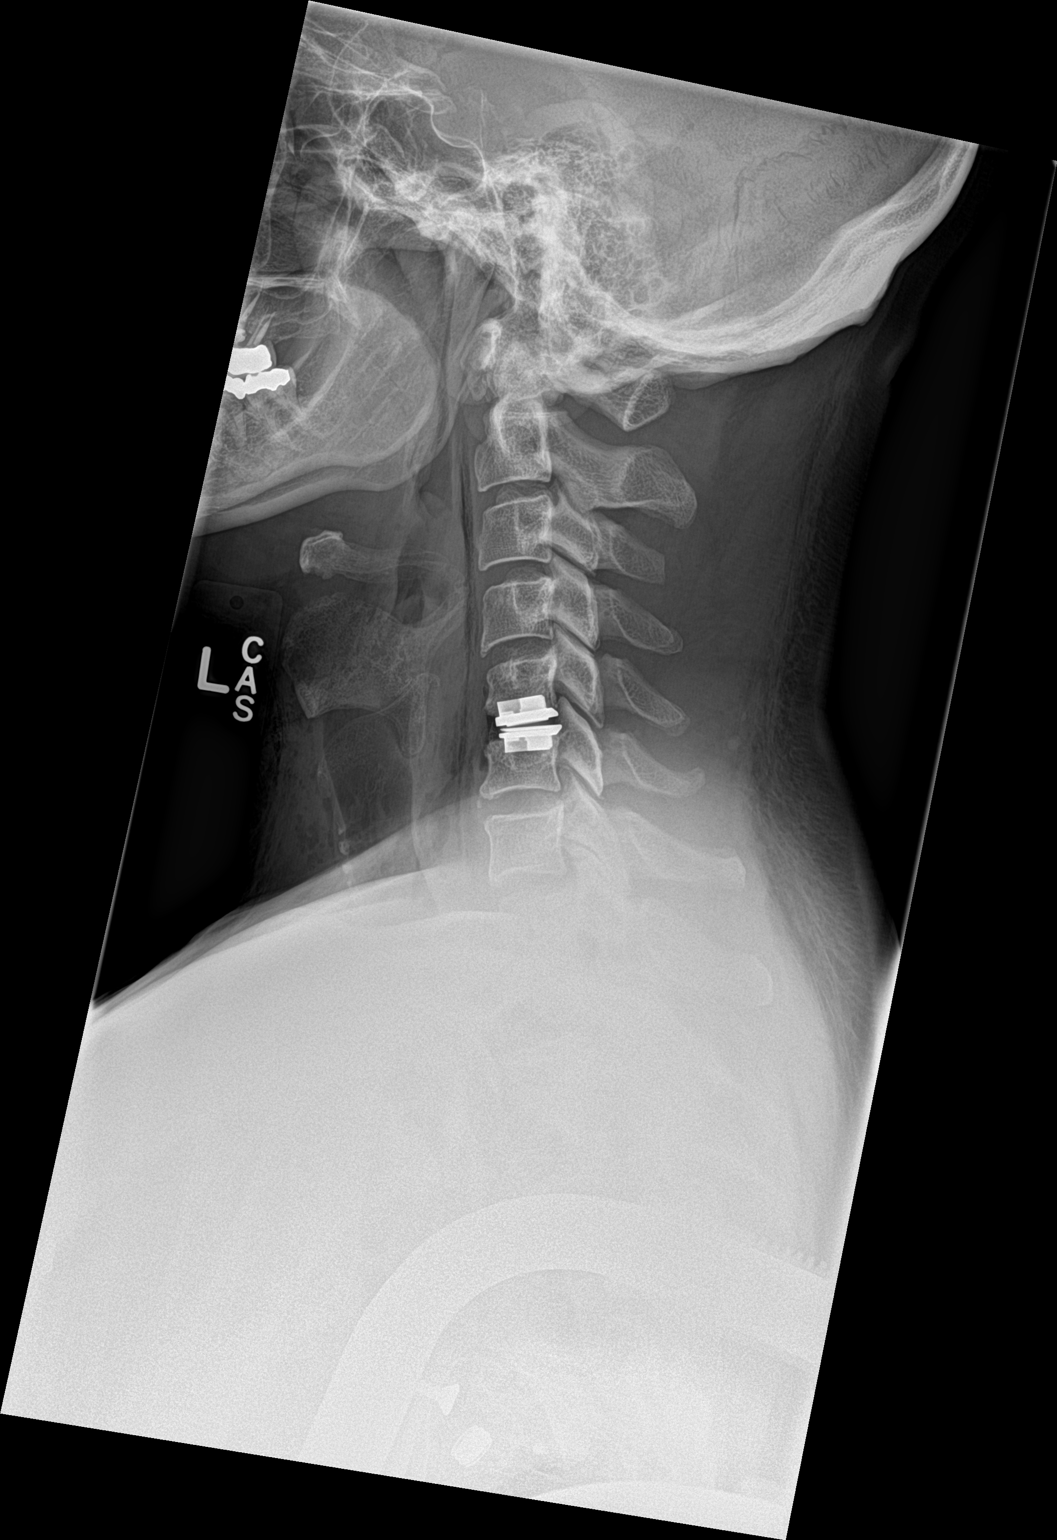

[c-spine ap]
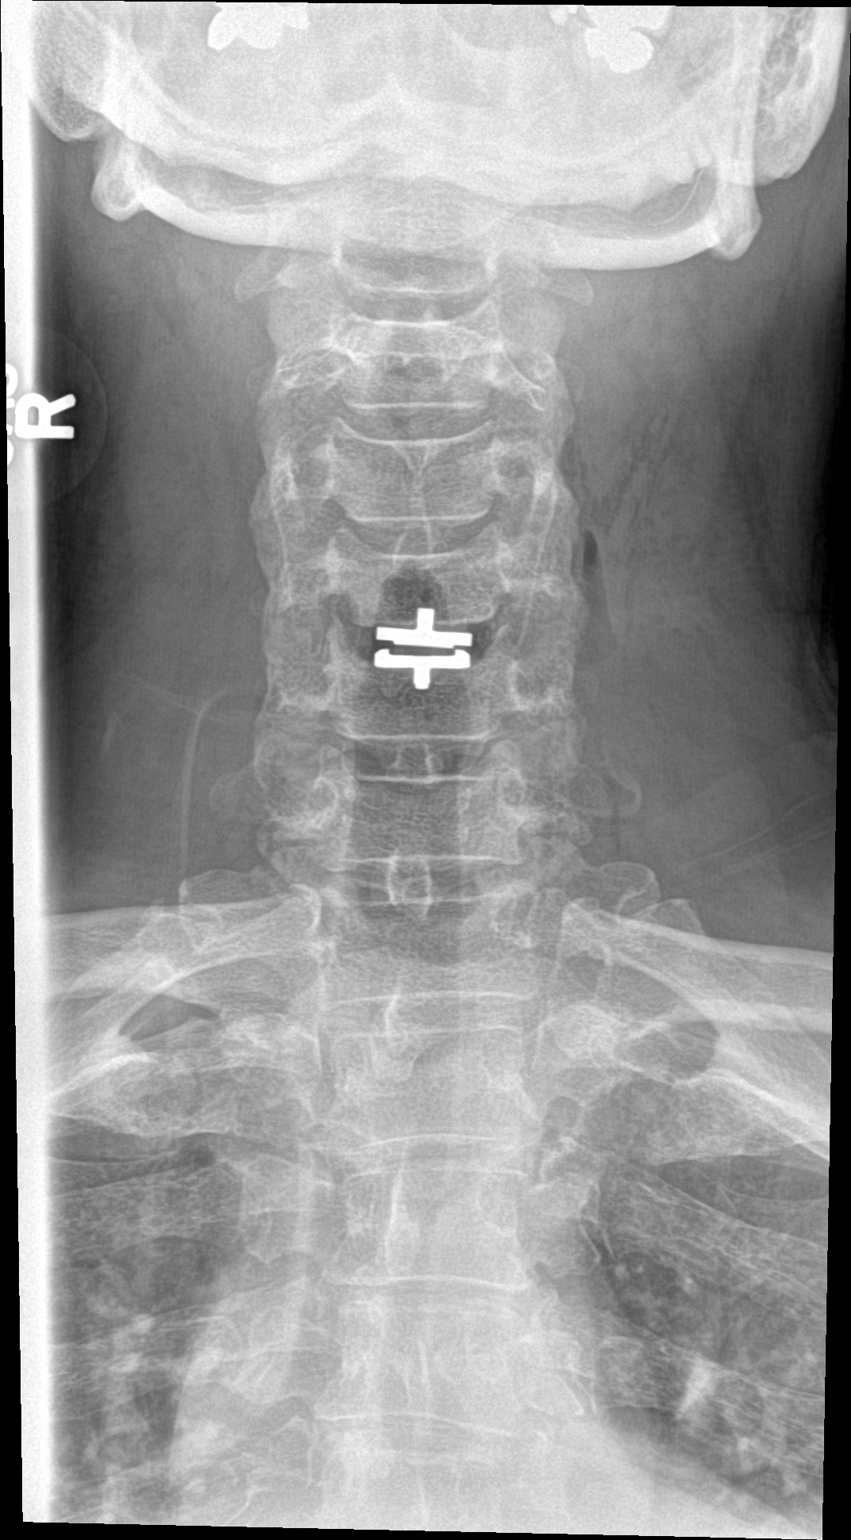

[2 of 2 positions shown; findings below may reference images not displayed]

FINDINGS: Spinal hardware at C5-C6 disc space post insertion of an artificial
disc.

Vertebral body and disc space heights maintained.

Appear prominent prevertebral soft tissues at C4 to C7 consistent
with preceding surgery.

Scattered soft tissue gas LEFT of midline from preceding surgery.

Vertebral body and disc space heights otherwise maintained.

No fracture, subluxation or bone destruction.
IMPRESSION: Spinal hardware at C5-C6 post insertion of an artificial disc.

No acute abnormalities.

## 2015-11-02 IMAGING — RF DG CERVICAL SPINE 1V
1 series · 1 of 1 positions shown · non-contrast
Comparison: None.

CLINICAL DATA: C5-6 disc replacement

EXAM:
DG C-ARM 61-120 MIN; DG CERVICAL SPINE - 1 VIEW

[Series 1: run · 1 of 1 slices shown]
[im 1/1]
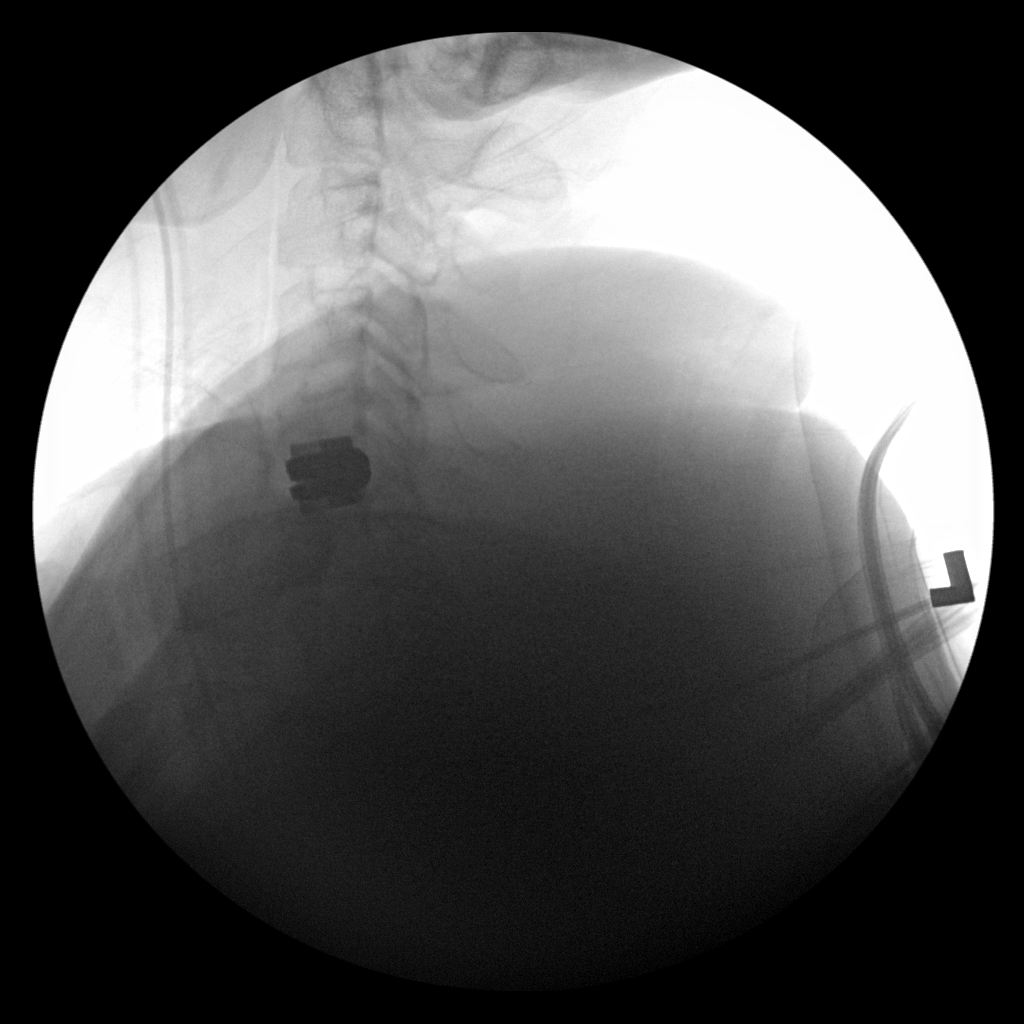

[1 of 1 positions shown; findings below may reference images not displayed]

FINDINGS: Single lateral intraoperative image demonstrates interbody hardware
at the C5-C6 level. Vertebral body heights are preserved where
visualized. Grossly normal alignment.
IMPRESSION: Interbody disc placed at C5-C6.

## 2016-06-27 ENCOUNTER — Emergency Department (HOSPITAL_COMMUNITY)
Admission: EM | Admit: 2016-06-27 | Discharge: 2016-06-27 | Disposition: A | Discharge status: Home / Self Care | Attending: Emergency Medicine | Admitting: Emergency Medicine

## 2016-06-27 ENCOUNTER — Encounter (HOSPITAL_COMMUNITY): Payer: Self-pay

## 2016-06-27 DIAGNOSIS — I10 Essential (primary) hypertension: Secondary | ICD-10-CM | POA: Diagnosis not present

## 2016-06-27 DIAGNOSIS — Z79899 Other long term (current) drug therapy: Secondary | ICD-10-CM | POA: Insufficient documentation

## 2016-06-27 DIAGNOSIS — J069 Acute upper respiratory infection, unspecified: Secondary | ICD-10-CM | POA: Diagnosis not present

## 2016-06-27 DIAGNOSIS — B9789 Other viral agents as the cause of diseases classified elsewhere: Secondary | ICD-10-CM

## 2016-06-27 DIAGNOSIS — R05 Cough: Secondary | ICD-10-CM | POA: Diagnosis present

## 2016-06-27 MED ORDER — BENZONATATE 100 MG PO CAPS: 100.0000 mg | ORAL_CAPSULE | Freq: Two times a day (BID) | ORAL | 0 refills | Status: DC | PRN

## 2016-06-27 MED ORDER — DM-GUAIFENESIN ER 30-600 MG PO TB12: 1.0000 | ORAL_TABLET | Freq: Two times a day (BID) | ORAL | 0 refills | Status: DC | PRN

## 2016-06-27 MED ORDER — IBUPROFEN 600 MG PO TABS: 600.0000 mg | ORAL_TABLET | Freq: Three times a day (TID) | ORAL | 0 refills | Status: AC | PRN

## 2016-06-27 NOTE — ED Provider Notes (Signed)
[facility]-EM[facility]ENCY DEPT Provider Note   CSN: [id number] Arrival date & time: 06/27/16  1151   By signing my name below, I, [name], attest that this documentation has been prepared under the direction and in the presence of Advent[facility] TampaEmily Shota Kohrs PA-C.  Electronically Signed: Suzan SlickAshley N. Elon Friedman, ED Scribe. 06/27/16. 1:00 PM.   History   Chief Complaint Chief Complaint  Patient presents with  . Cough  . Nasal Congestion  . Sore Throat   The history is provided by the patient. No language interpreter was used.    HPI Comments: [name] is a 55 y.o. male with a PMHx of HTN who presents to the Emergency Department complaining of intermittent, worsening cough x 1 week. He also reports congestion, rhinorrhea, hoarse voice, "tightness" to throat, and sore throat. Cough is much worse at night time. No alleviating factors at this time. OTC TheraFlu without any long term improvement. No recent fever, chills, nausea, or vomiting.  PCP: [name],[name], MD    Past Medical History:  Diagnosis Date  . Arthritis   . Complication of anesthesia    admit with post-op pneumonitis 04/21/11 (LMA had become dislodged during surgery earlier that day)  . Difficult intubation    ? difficult intubation per pt 04/21/11 ([facility]); however, notes indicate LMA disloged and unsuccesfful attempt to replace-->admitted with post-op pneumonitis  . GERD (gastroesophageal reflux disease)   . Hypertension     Patient Active Problem List   Diagnosis Date Noted  . Cervical radiculopathy 02/24/2015    Past Surgical History:  Procedure Laterality Date  . CERVICAL DISC ARTHROPLASTY N/A 02/24/2015   Procedure: Cervical five-six Artificial disc replacement;  Surgeon: [name], MD;  Location: [facility];  Service: Neurosurgery;  Laterality: N/A;  C5-6 Artificial disc replacement  . VASECTOMY    . WRIST SURGERY         Home Medications    Prior to Admission medications   Medication Sig Start Date End  Date Taking? Authorizing Provider  acetaminophen (TYLENOL) 500 MG tablet Take 1,000 mg by mouth every 4 (four) hours as needed for mild pain.    Historical Provider, MD  amLODipine (NORVASC) 2.5 MG tablet Take 2.5 mg by mouth daily.    Historical Provider, MD  atorvastatin (LIPITOR) 40 MG tablet Take 40 mg by mouth daily.    Historical Provider, MD  benzonatate (TESSALON) 100 MG capsule Take 1 capsule (100 mg total) by mouth 2 (two) times daily as needed for cough. 06/27/16   [name], PA-C  carvedilol (COREG) 25 MG tablet Take 12.5 mg by mouth 2 (two) times daily with a meal.    Historical Provider, MD  dextromethorphan-guaiFENesin (MUCINEX DM) 30-600 MG 12hr tablet Take 1 tablet by mouth 2 (two) times daily as needed for cough. 06/27/16   [name], PA-C  diazepam (VALIUM) 5 MG tablet Take 1 tablet (5 mg total) by mouth every 6 (six) hours as needed for anxiety. 02/24/15   [name], MD  ibuprofen (ADVIL,MOTRIN) 600 MG tablet Take 1 tablet (600 mg total) by mouth every 8 (eight) hours as needed for mild pain or moderate pain. 06/27/16   Trixie DredgeEmily [name], PA-C  Multiple Vitamin (MULTIVITAMIN WITH MINERALS) TABS Take 1 tablet by mouth daily.    Historical Provider, MD  oxyCODONE-acetaminophen (PERCOCET/ROXICET) 5-325 MG per tablet Take 1-2 tablets by mouth every 4 (four) hours as needed for moderate pain. 02/24/15   [name], MD  penicillin v potassium (VEETID) 500 MG tablet  Take 1 tablet (500 mg total) by mouth 4 (four) times daily. 10/24/15   [name], PA-C  Vitamin D, Ergocalciferol, (DRISDOL) 50000 UNITS CAPS capsule Take 50,000 Units by mouth 2 (two) times a week. *takes on mondays and fridays*    Historical Provider, MD    Family History No family history on file.  Social History Social History  Substance Use Topics  . Smoking status: Never Smoker  . Smokeless tobacco: Former NeurosurgeonUser  . Alcohol use No     Allergies   Patient has no known allergies.   Review of  Systems Review of Systems  Constitutional: Negative for activity change, appetite change, chills and fever.  HENT: Positive for congestion, rhinorrhea, sore throat and voice change. Negative for ear pain, facial swelling, sinus pressure and trouble swallowing.   Respiratory: Positive for cough.   Gastrointestinal: Negative for nausea and vomiting.  Musculoskeletal: Negative for myalgias, neck pain and neck stiffness.  Skin: Negative for rash.  Allergic/Immunologic: Negative for immunocompromised state.     Physical Exam Updated Vital Signs BP 141/100 (BP Location: Left Arm)   Pulse 80   Temp 97.6 F (36.4 C) (Oral)   Resp 18   SpO2 99%   Physical Exam  Constitutional: He appears well-developed and well-nourished. No distress.  Hoarse voice.  HENT:  Head: Normocephalic and atraumatic.  Mouth/Throat: Oropharynx is clear and moist. No oropharyngeal exudate.  Eyes: Conjunctivae and EOM are normal. Right eye exhibits no discharge. Left eye exhibits no discharge.  Neck: Normal range of motion. Neck supple.  Cardiovascular: Normal rate and regular rhythm.   Pulmonary/Chest: Effort normal and breath sounds normal. No stridor. No respiratory distress. He has no wheezes. He has no rales.  Lymphadenopathy:    He has no cervical adenopathy.  Neurological: He is alert.  Skin: He is not diaphoretic.  Nursing note and vitals reviewed.    ED Treatments / Results   DIAGNOSTIC STUDIES: Oxygen Saturation is 99% on RA, Normal to my interpretation.    COORDINATION OF CARE: 12:52 PM-Discussed treatment plan with pt at bedside and pt agreed to plan.     Labs (all labs ordered are listed, but only abnormal results are displayed) Labs Reviewed - No data to display  EKG  EKG Interpretation None       Radiology No results found.  Procedures Procedures (including critical care time)  Medications Ordered in ED Medications - No data to display   Initial Impression / Assessment  and Plan / ED Course  I have reviewed the triage vital signs and the nursing notes.  Pertinent labs & imaging results that were available during my care of the patient were reviewed by me and considered in my medical decision making (see chart for details).  Clinical Course      Final Clinical Impressions(s) / ED Diagnoses   Patients symptoms are consistent with URI, likely viral etiology. Discussed that antibiotics are not indicated for viral infections. Pt will be discharged with symptomatic treatment.  Verbalizes understanding and is agreeable with plan. Pt is hemodynamically stable & in NAD prior to dc. Discussed result, findings, treatment, and follow up  with patient.  Pt given return precautions.  Pt verbalizes understanding and agrees with plan.      Final diagnoses:  Viral URI with cough    New Prescriptions Discharge Medication List as of 06/27/2016  1:01 PM    START taking these medications   Details  benzonatate (TESSALON) 100 MG capsule Take 1 capsule (  100 mg total) by mouth 2 (two) times daily as needed for cough., Starting Sun 06/27/2016, Print    dextromethorphan-guaiFENesin (MUCINEX DM) 30-600 MG 12hr tablet Take 1 tablet by mouth 2 (two) times daily as needed for cough., Starting Sun 06/27/2016, Print       I personally performed the services described in this documentation, which was scribed in my presence. The recorded information has been reviewed and is accurate.    Trixie Dredge, PA-C 06/27/16 1443    Geoffery Lyons, MD 06/27/16 [phone]

## 2016-06-27 NOTE — ED Triage Notes (Signed)
Patient complains of 1 week of cough, congestion, sore throat. denies fever, NAD

## 2016-06-27 NOTE — Discharge Instructions (Signed)
Read the information below.  Use the prescribed medication as directed.  Please discuss all new medications with your pharmacist.  You may return to the Emergency Department at any time for worsening condition or any new symptoms that concern you.  If you develop high fevers that do not resolve with tylenol or ibuprofen, you have difficulty swallowing or breathing, or you are unable to tolerate fluids by mouth, return to the ER for a recheck.    °

## 2016-06-27 NOTE — ED Notes (Signed)
Declined W/C at D/C and was escorted to lobby by RN. 

## 2016-07-15 ENCOUNTER — Other Ambulatory Visit: Payer: Self-pay | Admitting: Nurse Practitioner

## 2016-07-20 ENCOUNTER — Other Ambulatory Visit: Payer: Self-pay | Admitting: Nurse Practitioner

## 2016-07-20 ENCOUNTER — Ambulatory Visit
Admission: RE | Admit: 2016-07-20 | Discharge: 2016-07-20 | Disposition: A | Discharge status: Home / Self Care | Source: Ambulatory Visit | Attending: Nurse Practitioner | Admitting: Nurse Practitioner

## 2016-07-20 DIAGNOSIS — R059 Cough, unspecified: Secondary | ICD-10-CM

## 2016-07-20 DIAGNOSIS — R05 Cough: Secondary | ICD-10-CM

## 2016-07-26 ENCOUNTER — Emergency Department (HOSPITAL_COMMUNITY)
Admission: EM | Admit: 2016-07-26 | Discharge: 2016-07-27 | Disposition: A | Discharge status: Home / Self Care | Attending: Emergency Medicine | Admitting: Emergency Medicine

## 2016-07-26 ENCOUNTER — Encounter (HOSPITAL_COMMUNITY): Payer: Self-pay | Admitting: Emergency Medicine

## 2016-07-26 DIAGNOSIS — I10 Essential (primary) hypertension: Secondary | ICD-10-CM

## 2016-07-26 DIAGNOSIS — R5383 Other fatigue: Secondary | ICD-10-CM | POA: Diagnosis not present

## 2016-07-26 NOTE — ED Triage Notes (Addendum)
Pt c/o being hypertensive.  St's he check his blood pressure at home and it was high.  Pt st's he is currently taking HTN meds for same.  Pt st's he feels slightly dizzy and his head feels hot.  Pt alert and oriented x's 3  Pt also st's he is currently taking antibiotic

## 2016-07-26 NOTE — ED Provider Notes (Signed)
[facility]EM[facility]Y DEPT Provider Note   CSN: [id number] Arrival date & time: 07/26/16  2017  By signing my name below, I, Emmanuella Mensah, attest that this documentation has been prepared under the direction and in the presence of Zadie Rhineonald Adelheid Hoggard, MD. Electronically Signed: Angelene GiovanniEmmanuella Mensah, ED Scribe. 07/27/16. 12:05 AM.   History   Chief Complaint Chief Complaint  Patient presents with  . Hypertension    HPI Comments: [name] is a 55 y.o. male with a hx of hypertension who presents to the Emergency Department requesting evaluation for elevated blood pressure (highest 164/105 here) onset this morning. He explains that when he woke up this morning, his bilateral legs felt heavy and he felt fatigued with his face flushed however his symptoms are gradually improving after he took his HTN medication PTA. He reports that he is currently on 2.5 mg Amlodipine daily and 12.5 mg Carvedilol x 2 daily and has tried to be complaint but he has missed some of his night time doses. No new HTN medications. He states that he has been on a course of Amoxicillin for a sinus infection for the past 7 days; 3 more days left and is not sure if these symptoms are due to him recovering from the sinus infection. He has NKDA. He reports that he drinks a lot of caffeine. Pt [place] current [place] He denies a hx of MI or stroke. He also denies any fever, chills, severe headache, visual changes, chest pain, SOB, LOC, abdominal pain, nausea, vomiting, or any other symptoms. Pt has upcoming PCP appointment in 2 days.    The history is provided by the patient. No language interpreter was used.  Hypertension  This is a chronic problem. The current episode started 6 to 12 hours ago. The problem has been gradually improving. Pertinent negatives include no chest pain, no abdominal pain, no headaches and no shortness of breath. Nothing aggravates the symptoms. The symptoms are relieved by  medications. Treatments tried: Amlodopine and Carvedilol. The treatment provided moderate relief.    Past Medical History:  Diagnosis Date  . Arthritis   . Complication of anesthesia    admit with post-op pneumonitis 04/21/11 (LMA had become dislodged during surgery earlier that day)  . Difficult intubation    ? difficult intubation per pt 04/21/11 ([facility]); however, notes indicate LMA disloged and unsuccesfful attempt to replace-->admitted with post-op pneumonitis  . GERD (gastroesophageal reflux disease)   . Hypertension     Patient Active Problem List   Diagnosis Date Noted  . Cervical radiculopathy 02/24/2015    Past Surgical History:  Procedure Laterality Date  . CERVICAL DISC ARTHROPLASTY N/A 02/24/2015   Procedure: Cervical five-six Artificial disc replacement;  Surgeon: [name], MD;  Location: [facility];  Service: Neurosurgery;  Laterality: N/A;  C5-6 Artificial disc replacement  . VASECTOMY    . WRIST SURGERY         Home Medications    Prior to Admission medications   Medication Sig Start Date End Date Taking? Authorizing Provider  acetaminophen (TYLENOL) 500 MG tablet Take 1,000 mg by mouth every 4 (four) hours as needed for mild pain.    Historical Provider, MD  amLODipine (NORVASC) 2.5 MG tablet Take 2.5 mg by mouth daily.    Historical Provider, MD  atorvastatin (LIPITOR) 40 MG tablet Take 40 mg by mouth daily.    Historical Provider, MD  benzonatate (TESSALON) 100 MG capsule Take 1 capsule (100 mg total) by  mouth 2 (two) times daily as needed for cough. 06/27/16   Trixie DredgeEmily West, PA-C  carvedilol (COREG) 25 MG tablet Take 12.5 mg by mouth 2 (two) times daily with a meal.    Historical Provider, MD  dextromethorphan-guaiFENesin (MUCINEX DM) 30-600 MG 12hr tablet Take 1 tablet by mouth 2 (two) times daily as needed for cough. 06/27/16   Trixie DredgeEmily West, PA-C  diazepam (VALIUM) 5 MG tablet Take 1 tablet (5 mg total) by mouth every 6 (six) hours as needed for  anxiety. 02/24/15   [name], MD  ibuprofen (ADVIL,MOTRIN) 600 MG tablet Take 1 tablet (600 mg total) by mouth every 8 (eight) hours as needed for mild pain or moderate pain. 06/27/16   Trixie DredgeEmily West, PA-C  Multiple Vitamin (MULTIVITAMIN WITH MINERALS) TABS Take 1 tablet by mouth daily.    Historical Provider, MD  oxyCODONE-acetaminophen (PERCOCET/ROXICET) 5-325 MG per tablet Take 1-2 tablets by mouth every 4 (four) hours as needed for moderate pain. 02/24/15   [name], MD  penicillin v potassium (VEETID) 500 MG tablet Take 1 tablet (500 mg total) by mouth 4 (four) times daily. 10/24/15   [name], PA-C  Vitamin D, Ergocalciferol, (DRISDOL) 50000 UNITS CAPS capsule Take 50,000 Units by mouth 2 (two) times a week. *takes on mondays and fridays*    Historical Provider, MD    Family History No family history on file.  Social History Social History  Substance Use Topics  . Smoking status: Never Smoker  . Smokeless tobacco: Former NeurosurgeonUser  . Alcohol use No     Allergies   Patient has no known allergies.   Review of Systems Review of Systems  Constitutional: Negative for chills and fever.  Eyes: Negative for visual disturbance.  Respiratory: Negative for shortness of breath.   Cardiovascular: Negative for chest pain.  Gastrointestinal: Negative for abdominal pain, nausea and vomiting.  Neurological: Positive for weakness. Negative for headaches.  All other systems reviewed and are negative.    Physical Exam Updated Vital Signs BP 128/96 (BP Location: Right Arm)   Pulse 79   Temp 97.9 F (36.6 C) (Oral)   Resp 20   Ht 5\' 5"  (1.651 m)   Wt 174 lb (78.9 kg)   SpO2 97%   BMI 28.96 kg/m   Physical Exam  Nursing note and vitals reviewed.  CONSTITUTIONAL: Well developed/well nourished HEAD: Normocephalic/atraumatic EYES: EOMI/PERRL, no nystagmus, no ptosis ENMT: Mucous membranes moist NECK: supple no meningeal signs, no bruits CV: S1/S2 noted, no  murmurs/rubs/gallops noted LUNGS: Lungs are clear to auscultation bilaterally, no apparent distress ABDOMEN: soft, nontender, no rebound or guarding GU:no cva tenderness NEURO:Awake/alert, face symmetric, no arm or leg drift is noted Equal 5/5 strength with shoulder abduction, elbow flex/extension, wrist flex/extension in upper extremities and equal hand grips bilaterally Equal 5/5 strength with hip flexion,knee flex/extension, foot dorsi/plantar flexion Cranial nerves 3/4/5/6/02/14/09/11/12 tested and intact Gait normal without ataxia No past pointing Sensation to light touch intact in all extremities EXTREMITIES: pulses normal, full ROM SKIN: warm, color normal PSYCH: no abnormalities of mood noted  ED Treatments / Results  DIAGNOSTIC STUDIES: Oxygen Saturation is 98% on RA, normal by my interpretation.    COORDINATION OF CARE: 11:59 PM- Pt advised of plan for treatment and pt agrees. Advised to follow up with PCP.   Procedures Procedures (including critical care time)  Medications Ordered in ED Medications - No data to display   Initial Impression / Assessment and Plan / ED Course  Zadie Rhineonald Lenya Sterne, MD  has reviewed the triage vital signs and the nursing notes. .  Clinical Course     Pt well appearing Smiling, no distress, conversant, ambulatory He feels improved No signs of hypertensive emergency He has PCP f/u in 2 days - he will discuss at that time with PCP about possibly increasing his amlodipine Pt agreeable We discussed strict ER return precautions Advised to cut back on caffeine   Final Clinical Impressions(s) / ED Diagnoses   Final diagnoses:  Essential hypertension  Other fatigue    New Prescriptions New Prescriptions   No medications on file   I personally performed the services described in this documentation, which was scribed in my presence. The recorded information has been reviewed and is accurate.        Zadie Rhine, MD 07/27/16  [phone]

## 2016-07-26 NOTE — ED Notes (Signed)
ED Provider at bedside. 

## 2016-09-07 NOTE — Progress Notes (Deleted)
Cardiology Office Note   Date:  09/07/2016   ID:  [name], DOB Jun 04, 1961, MRN [id number]  PCP:  Gwynneth Aliment, MD  Cardiologist:   Chilton Si, MD   No chief complaint on file.     History of Present Illness: [name] is a 56 y.o. male with hypertension and GERD who presents for ***  [name] was seen in the ED 07/26/16 with hypertensive urgency.  His blood pressure was 164/105.  At the time he reported missing several doses of his medications.  Past Medical History:  Diagnosis Date  . Arthritis   . Complication of anesthesia    admit with post-op pneumonitis 04/21/11 (LMA had become dislodged during surgery earlier that day)  . Difficult intubation    ? difficult intubation per pt 04/21/11 ([facility]); however, notes indicate LMA disloged and unsuccesfful attempt to replace-->admitted with post-op pneumonitis  . GERD (gastroesophageal reflux disease)   . Hypertension     Past Surgical History:  Procedure Laterality Date  . CERVICAL DISC ARTHROPLASTY N/A 02/24/2015   Procedure: Cervical five-six Artificial disc replacement;  Surgeon: Barnett Abu, MD;  Location: [facility];  Service: Neurosurgery;  Laterality: N/A;  C5-6 Artificial disc replacement  . VASECTOMY    . WRIST SURGERY       Current Outpatient Prescriptions  Medication Sig Dispense Refill  . acetaminophen (TYLENOL) 500 MG tablet Take 1,000 mg by mouth every 4 (four) hours as needed for mild pain.    Marland Kitchen amLODipine (NORVASC) 2.5 MG tablet Take 2.5 mg by mouth daily.    Marland Kitchen atorvastatin (LIPITOR) 40 MG tablet Take 40 mg by mouth daily.    . benzonatate (TESSALON) 100 MG capsule Take 1 capsule (100 mg total) by mouth 2 (two) times daily as needed for cough. 15 capsule 0  . carvedilol (COREG) 25 MG tablet Take 12.5 mg by mouth 2 (two) times daily with a meal.    . dextromethorphan-guaiFENesin (MUCINEX DM) 30-600 MG 12hr tablet Take 1 tablet by mouth 2 (two) times daily as needed for cough. 20  tablet 0  . diazepam (VALIUM) 5 MG tablet Take 1 tablet (5 mg total) by mouth every 6 (six) hours as needed for anxiety. 30 tablet 0  . ibuprofen (ADVIL,MOTRIN) 600 MG tablet Take 1 tablet (600 mg total) by mouth every 8 (eight) hours as needed for mild pain or moderate pain. 15 tablet 0  . Multiple Vitamin (MULTIVITAMIN WITH MINERALS) TABS Take 1 tablet by mouth daily.    Marland Kitchen oxyCODONE-acetaminophen (PERCOCET/ROXICET) 5-325 MG per tablet Take 1-2 tablets by mouth every 4 (four) hours as needed for moderate pain. 30 tablet 0  . penicillin v potassium (VEETID) 500 MG tablet Take 1 tablet (500 mg total) by mouth 4 (four) times daily. 40 tablet 0  . Vitamin D, Ergocalciferol, (DRISDOL) 50000 UNITS CAPS capsule Take 50,000 Units by mouth 2 (two) times a week. *takes on mondays and fridays*     No current facility-administered medications for this visit.     Allergies:   Patient has no known allergies.    Social History:  The patient  reports that he has never smoked. He has quit using smokeless tobacco. He reports that he does not drink alcohol or use drugs.   Family History:  The patient's ***family history is not on file.    ROS:  Please see the history of present illness.   Otherwise, review of systems are positive for {NONE DEFAULTED:18576::"none"}.  All other systems are reviewed and negative.    PHYSICAL EXAM: VS:  There were no vitals taken for this visit. , BMI There is no height or weight on file to calculate BMI. GENERAL:  Well appearing HEENT:  Pupils equal round and reactive, fundi not visualized, oral mucosa unremarkable NECK:  No jugular venous distention, waveform within normal limits, carotid upstroke brisk and symmetric, no bruits, no thyromegaly LYMPHATICS:  No cervical adenopathy LUNGS:  Clear to auscultation bilaterally HEART:  RRR.  PMI not displaced or sustained,S1 and S2 within normal limits, no S3, no S4, no clicks, no rubs, *** murmurs ABD:  Flat, positive bowel  sounds normal in frequency in pitch, no bruits, no rebound, no guarding, no midline pulsatile mass, no hepatomegaly, no splenomegaly EXT:  2 plus pulses throughout, no edema, no cyanosis no clubbing SKIN:  No rashes no nodules NEURO:  Cranial nerves II through XII grossly intact, motor grossly intact throughout PSYCH:  Cognitively intact, oriented to person place and time    EKG:  EKG {ACTION; IS/IS ZOX:[id number]}OT:[id number]} ordered today. The ekg ordered today demonstrates ***   Recent Labs: No results found for requested labs within last 8760 hours.    Lipid Panel    Component Value Date/Time   CHOL  01/09/2008 0540    122        ATP III CLASSIFICATION:  <200     mg/dL   Desirable  [id number]  mg/dL   Borderline High  >=478>=240    mg/dL   High   TRIG 77 29/56/213006/09/2007 0540   HDL 37 (L) 01/09/2008 0540   CHOLHDL 3.3 01/09/2008 0540   VLDL 15 01/09/2008 0540   LDLCALC  01/09/2008 0540    70        Total Cholesterol/HDL:CHD Risk Coronary Heart Disease Risk Table                     Men   Women  1/2 Average Risk   3.4   3.3      Wt Readings from Last 3 Encounters:  07/26/16 78.9 kg (174 lb)  02/24/15 79.4 kg (175 lb)  02/19/15 79.5 kg (175 lb 4.3 oz)      ASSESSMENT AND PLAN:  ***   Current medicines are reviewed at length with the patient today.  The patient {ACTIONS; HAS/DOES NOT HAVE:19233} concerns regarding medicines.  The following changes have been made:  {PLAN; NO CHANGE:13088:s}  Labs/ tests ordered today include: *** No orders of the defined types were placed in this encounter.    Disposition:   FU with ***    This note was written with the assistance of speech recognition software.  Please excuse any transcriptional errors.  Signed, Marlin Brys C. Duke Salviaandolph, MD, [facility]  09/07/2016 9:17 PM    [facility]

## 2016-09-08 ENCOUNTER — Ambulatory Visit: Admitting: Cardiovascular Disease

## 2018-04-25 DIAGNOSIS — E782 Mixed hyperlipidemia: Secondary | ICD-10-CM | POA: Diagnosis not present

## 2018-04-25 DIAGNOSIS — Z23 Encounter for immunization: Secondary | ICD-10-CM | POA: Diagnosis not present

## 2018-04-25 DIAGNOSIS — I10 Essential (primary) hypertension: Secondary | ICD-10-CM | POA: Diagnosis not present

## 2018-04-25 DIAGNOSIS — R7309 Other abnormal glucose: Secondary | ICD-10-CM | POA: Diagnosis not present

## 2018-04-25 DIAGNOSIS — Z8249 Family history of ischemic heart disease and other diseases of the circulatory system: Secondary | ICD-10-CM

## 2018-06-12 ENCOUNTER — Other Ambulatory Visit: Payer: Self-pay

## 2018-06-12 ENCOUNTER — Emergency Department (HOSPITAL_COMMUNITY)
Admission: EM | Admit: 2018-06-12 | Discharge: 2018-06-12 | Disposition: A | Discharge status: Home / Self Care | Attending: Emergency Medicine | Admitting: Emergency Medicine

## 2018-06-12 ENCOUNTER — Encounter (HOSPITAL_COMMUNITY): Payer: Self-pay | Admitting: Emergency Medicine

## 2018-06-12 DIAGNOSIS — R5383 Other fatigue: Secondary | ICD-10-CM | POA: Insufficient documentation

## 2018-06-12 DIAGNOSIS — Z5321 Procedure and treatment not carried out due to patient leaving prior to being seen by health care provider: Secondary | ICD-10-CM | POA: Diagnosis not present

## 2018-06-12 DIAGNOSIS — R200 Anesthesia of skin: Secondary | ICD-10-CM | POA: Insufficient documentation

## 2018-06-12 LAB — CBC WITH DIFFERENTIAL/PLATELET
Abs Immature Granulocytes: 0.03 10*3/uL (ref 0.00–0.07)
BASOS PCT: 1 %
Basophils Absolute: 0 10*3/uL (ref 0.0–0.1)
Eosinophils Absolute: 0.2 10*3/uL (ref 0.0–0.5)
Eosinophils Relative: 2 %
HCT: 47.1 % (ref 39.0–52.0)
Hemoglobin: 14.9 g/dL (ref 13.0–17.0)
Immature Granulocytes: 1 %
Lymphocytes Relative: 28 %
Lymphs Abs: 1.8 10*3/uL (ref 0.7–4.0)
MCH: 28.7 pg (ref 26.0–34.0)
MCHC: 31.6 g/dL (ref 30.0–36.0)
MCV: 90.6 fL (ref 80.0–100.0)
MONO ABS: 0.6 10*3/uL (ref 0.1–1.0)
Monocytes Relative: 10 %
Neutro Abs: 3.9 10*3/uL (ref 1.7–7.7)
Neutrophils Relative %: 58 %
Platelets: 215 10*3/uL (ref 150–400)
RBC: 5.2 MIL/uL (ref 4.22–5.81)
RDW: 12.9 % (ref 11.5–15.5)
WBC: 6.5 10*3/uL (ref 4.0–10.5)
nRBC: 0 % (ref 0.0–0.2)

## 2018-06-12 LAB — BASIC METABOLIC PANEL
ANION GAP: 10 (ref 5–15)
BUN: 11 mg/dL (ref 6–20)
CHLORIDE: 106 mmol/L (ref 98–111)
CO2: 25 mmol/L (ref 22–32)
Calcium: 9.6 mg/dL (ref 8.9–10.3)
Creatinine, Ser: 0.92 mg/dL (ref 0.61–1.24)
GFR calc Af Amer: >60 mL/min (ref >60)
Glucose, Bld: 92 mg/dL (ref 70–99)
Potassium: 4.6 mmol/L (ref 3.5–5.1)
Sodium: 141 mmol/L (ref 135–145)

## 2018-06-12 NOTE — ED Notes (Signed)
Pt up to desk stating that he needs to leave. Armband removed.

## 2018-06-12 NOTE — ED Provider Notes (Signed)
Patient placed in Quick Look pathway, seen and evaluated   Chief Complaint: facial numbness  HPI: [name] is a 57 y.o. male who presents to the ED with facial numbness and tingling. Patient denies shortness of breath or chest pain. Patient does report he has been under a lot of stress and working hard and has been very tired.   ROS: Neuro: facial tigling  General: fatigue  Physical Exam:  BP (!) 161/104 (BP Location: Right Arm)   Pulse 100   Temp 98.2 F (36.8 C) (Oral)   Resp 20   Ht 5\' 5"  (1.651 m)   Wt 80.7 kg   SpO2 97%   BMI 29.62 kg/m    Gen: No distress  Neuro: Awake and Alert, grips are equal, no pronator drift  Skin: Warm and dry  Resp: no distress  Initiation of care has begun. The patient has been counseled on the process, plan, and necessity for staying for the completion/evaluation, and the remainder of the medical screening examination    Janne Napoleon, NP 06/12/18 1846    Loren Racer, MD 07/21/18 [phone]

## 2018-06-12 NOTE — ED Triage Notes (Signed)
Pt to ED st's he has been under a lot of stress lately.  Pt st's all day today he has felt a tingling in his face.  Also st's his blood pressure was high at home and he took his b/P meds.  At this time pt st's he feels fatigued.

## 2018-08-14 ENCOUNTER — Encounter: Payer: Self-pay | Admitting: Nurse Practitioner

## 2018-08-14 ENCOUNTER — Ambulatory Visit (INDEPENDENT_AMBULATORY_CARE_PROVIDER_SITE_OTHER): Admitting: Nurse Practitioner

## 2018-08-14 VITALS — BP 132/88 | HR 82 | Temp 97.8°F | Ht 64.4 in | Wt 182.0 lb

## 2018-08-14 DIAGNOSIS — Z125 Encounter for screening for malignant neoplasm of prostate: Secondary | ICD-10-CM

## 2018-08-14 DIAGNOSIS — Z0000 Encounter for general adult medical examination without abnormal findings: Secondary | ICD-10-CM

## 2018-08-14 DIAGNOSIS — R7303 Prediabetes: Secondary | ICD-10-CM

## 2018-08-14 DIAGNOSIS — I10 Essential (primary) hypertension: Secondary | ICD-10-CM | POA: Diagnosis not present

## 2018-08-14 DIAGNOSIS — E782 Mixed hyperlipidemia: Secondary | ICD-10-CM

## 2018-08-14 DIAGNOSIS — Z139 Encounter for screening, unspecified: Secondary | ICD-10-CM

## 2018-08-14 LAB — POCT URINALYSIS DIPSTICK
Bilirubin, UA: NEGATIVE
Glucose, UA: NEGATIVE
Ketones, UA: NEGATIVE
Leukocytes, UA: NEGATIVE
NITRITE UA: NEGATIVE
PH UA: 6 (ref 5.0–8.0)
Protein, UA: NEGATIVE
RBC UA: NEGATIVE
SPEC GRAV UA: 1.01 (ref 1.010–1.025)
UROBILINOGEN UA: 0.2 U/dL

## 2018-08-14 LAB — POCT UA - MICROALBUMIN
Albumin/Creatinine Ratio, Urine, POC: 30
Creatinine, POC: 200 mg/dL
Microalbumin Ur, POC: 10 mg/L

## 2018-08-14 NOTE — Patient Instructions (Signed)

## 2018-08-14 NOTE — Progress Notes (Signed)
Subjective:     Patient ID: [name] , male    DOB: 1960/11/30 , 58 y.o.   MRN: [id number]   Chief Complaint  Patient presents with  . Annual Exam   Men's preventive visit. Patient Health Questionnaire (PHQ-2) is    Office Visit from 08/14/2018 in [facility]  PHQ-2 Total Score  0    Patient is on a mostly fish diet, no red meat. Marital status: Married. Relevant history for alcohol use is:  Social History   Substance and Sexual Activity  Alcohol Use No  . Relevant history for tobacco use is:  Social History   Tobacco Use  Smoking Status Never Smoker  Smokeless Tobacco Former Lexicographer at least 2 times per week.  HPI  Here for Health Maintenance  He is having an MRI of his left shoulder at the [place] next month and an MRI of his back by Dr. Luna Kitchens [facility]).    Hyperlidemia - taking atorvastatin daily.    Hypertension  This is a chronic problem. The current episode started more than 1 year ago. The problem is unchanged. The problem is controlled. Pertinent negatives include no anxiety or malaise/fatigue. There are no associated agents to hypertension. There are no known risk factors for coronary artery disease. Past treatments include nothing. Compliance problems include exercise.  There is no history of angina. There is no history of chronic renal disease.     Past Medical History:  Diagnosis Date  . Arthritis   . Complication of anesthesia    admit with post-op pneumonitis 04/21/11 (LMA had become dislodged during surgery earlier that day)  . Difficult intubation    ? difficult intubation per pt 04/21/11 ([facility]); however, notes indicate LMA disloged and unsuccesfful attempt to replace-->admitted with post-op pneumonitis  . GERD (gastroesophageal reflux disease)   . Hypertension      No family history on file.   Current Outpatient Medications:  .  amLODipine (NORVASC) 2.5 MG tablet, Take 2.5 mg by mouth daily., Disp:  , Rfl:  .  atorvastatin (LIPITOR) 40 MG tablet, Take 40 mg by mouth daily., Disp: , Rfl:  .  cyclobenzaprine (FLEXERIL) 10 MG tablet, Take 10 mg by mouth at bedtime., Disp: , Rfl:  .  ibuprofen (ADVIL,MOTRIN) 600 MG tablet, Take 1 tablet (600 mg total) by mouth every 8 (eight) hours as needed for mild pain or moderate pain., Disp: 15 tablet, Rfl: 0 .  meloxicam (MOBIC) 15 MG tablet, Take 15 mg by mouth daily., Disp: , Rfl:  .  Multiple Vitamin (MULTIVITAMIN WITH MINERALS) TABS, Take 1 tablet by mouth daily., Disp: , Rfl:    No Known Allergies   Review of Systems  Constitutional: Negative.  Negative for malaise/fatigue.  HENT: Negative.   Eyes: Negative.   Respiratory: Negative.   Cardiovascular: Negative.   Gastrointestinal: Negative.   Endocrine: Negative.   Genitourinary: Negative.   Musculoskeletal: Negative.   Skin: Negative.   Allergic/Immunologic: Negative.   Neurological: Negative.   Hematological: Negative.   Psychiatric/Behavioral: Negative.      Today's Vitals   08/14/18 0950  BP: 132/88  Pulse: 82  Temp: 97.8 F (36.6 C)  TempSrc: Oral  SpO2: 97%  Weight: 182 lb (82.6 kg)  Height: 5' 4.4" (1.636 m)  PainSc: 4   PainLoc: Back   Body mass index is 30.85 kg/m.   Objective:  Physical Exam Vitals signs reviewed.  Constitutional:      Appearance: Normal appearance.  HENT:     Head: Normocephalic and atraumatic.     Right Ear: Tympanic membrane, ear canal and external ear normal.     Left Ear: Tympanic membrane, ear canal and external ear normal.     Nose: Nose normal. No congestion.     Mouth/Throat:     Mouth: Mucous membranes are moist.  Eyes:     Extraocular Movements: Extraocular movements intact.     Conjunctiva/sclera: Conjunctivae normal.     Pupils: Pupils are equal, round, and reactive to light.  Neck:     Musculoskeletal: Normal range of motion and neck supple.  Cardiovascular:     Rate and Rhythm: Normal rate and regular rhythm.      Pulses: Normal pulses.     Heart sounds: Normal heart sounds.  Pulmonary:     Effort: Pulmonary effort is normal.     Breath sounds: Normal breath sounds.  Abdominal:     General: Abdomen is flat. Bowel sounds are normal.     Palpations: Abdomen is soft.  Musculoskeletal: Normal range of motion.  Skin:    General: Skin is warm and dry.     Capillary Refill: Capillary refill takes less than 2 seconds.  Neurological:     General: No focal deficit present.     Mental Status: He is alert and oriented to person, place, and time.  Psychiatric:        Mood and Affect: Mood normal.         Assessment And Plan:     1. Routine general medical examination at a health care facility . Behavior modifications discussed and diet history reviewed.   . Pt will continue to exercise regularly and modify diet with low GI, plant based foods and decrease intake of processed foods.  . Recommend intake of daily multivitamin, Vitamin D, and calcium.  . Recommend mammogram and colonoscopy for preventive screenings, as well as recommend immunizations that include influenza, TDAP   2. Encounter for prostate cancer screening  Normal manual prostate exam - PSA(Must document that pt has been informed of limitations of PSA testing.)  3. Encounter for screening  - HIV antibody  4. Essential hypertension . B/P is controlled.  . CMP ordered to check renal function.  . The importance of regular exercise and dietary modification was stressed to the patient.  . Stressed importance of losing ten percent of her body weight to help with B/P control.  . The weight loss would help with decreasing cardiac and cancer risk as well.  - EKG 12-Lead - POCT Urinalysis Dipstick (81002) - POCT UA - Microalbumin - CBC - Comprehensive metabolic panel  5. Mixed hyperlipidemia  Chronic, controlled  Continue with current medications - Lipid panel  6. Prediabetes  Chronic, stable  Continue with current  medications  Encouraged to limit intake of sugary foods and drinks  Encouraged to increase physical activity to 150 minutes per week - Comprehensive metabolic panel - Hemoglobin A1c         Arnette Felts, FNP

## 2018-08-15 LAB — COMPREHENSIVE METABOLIC PANEL
ALK PHOS: 82 IU/L (ref 39–117)
ALT: 62 IU/L — ABNORMAL HIGH (ref 0–44)
AST: 31 IU/L (ref 0–40)
Albumin/Globulin Ratio: 2.1 (ref 1.2–2.2)
Albumin: 4.8 g/dL (ref 3.5–5.5)
BUN / CREAT RATIO: 10 (ref 9–20)
BUN: 10 mg/dL (ref 6–24)
Bilirubin Total: 0.7 mg/dL (ref 0.0–1.2)
CO2: 23 mmol/L (ref 20–29)
Calcium: 9.3 mg/dL (ref 8.7–10.2)
Chloride: 102 mmol/L (ref 96–106)
Creatinine, Ser: 0.99 mg/dL (ref 0.76–1.27)
GFR calc Af Amer: 99 mL/min/{1.73_m2} (ref >59)
GFR, EST NON AFRICAN AMERICAN: 85 mL/min/{1.73_m2} (ref >59)
GLUCOSE: 101 mg/dL — AB (ref 65–99)
Globulin, Total: 2.3 g/dL (ref 1.5–4.5)
Potassium: 4.1 mmol/L (ref 3.5–5.2)
Sodium: 140 mmol/L (ref 134–144)
Total Protein: 7.1 g/dL (ref 6.0–8.5)

## 2018-08-15 LAB — CBC
Hematocrit: 44.6 % (ref 37.5–51.0)
Hemoglobin: 15.3 g/dL (ref 13.0–17.7)
MCH: 29.3 pg (ref 26.6–33.0)
MCHC: 34.3 g/dL (ref 31.5–35.7)
MCV: 85 fL (ref 79–97)
Platelets: 228 10*3/uL (ref 150–450)
RBC: 5.23 x10E6/uL (ref 4.14–5.80)
RDW: 13.2 % (ref 11.6–15.4)
WBC: 5 10*3/uL (ref 3.4–10.8)

## 2018-08-15 LAB — HIV ANTIBODY (ROUTINE TESTING W REFLEX): HIV Screen 4th Generation wRfx: NONREACTIVE

## 2018-08-15 LAB — HEMOGLOBIN A1C
ESTIMATED AVERAGE GLUCOSE: 134 mg/dL
HEMOGLOBIN A1C: 6.3 % — AB (ref 4.8–5.6)

## 2018-08-15 LAB — LIPID PANEL
Chol/HDL Ratio: 4.8 ratio (ref 0.0–5.0)
Cholesterol, Total: 188 mg/dL (ref 100–199)
HDL: 39 mg/dL — AB (ref >39)
LDL Calculated: 132 mg/dL — ABNORMAL HIGH (ref 0–99)
TRIGLYCERIDES: 85 mg/dL (ref 0–149)
VLDL Cholesterol Cal: 17 mg/dL (ref 5–40)

## 2018-08-15 LAB — PSA: Prostate Specific Ag, Serum: 2.6 ng/mL (ref 0.0–4.0)

## 2018-09-26 ENCOUNTER — Telehealth: Payer: Self-pay

## 2018-09-26 NOTE — Telephone Encounter (Signed)
Patient returned my call regarding his labs so we have discussed them. YRL,RMA

## 2018-11-28 ENCOUNTER — Other Ambulatory Visit: Payer: Self-pay

## 2018-11-28 ENCOUNTER — Encounter: Payer: Self-pay | Admitting: Nurse Practitioner

## 2018-11-28 ENCOUNTER — Ambulatory Visit (INDEPENDENT_AMBULATORY_CARE_PROVIDER_SITE_OTHER): Admitting: Nurse Practitioner

## 2018-11-28 VITALS — Wt 177.0 lb

## 2018-11-28 DIAGNOSIS — M25511 Pain in right shoulder: Secondary | ICD-10-CM

## 2018-11-28 DIAGNOSIS — M545 Low back pain, unspecified: Secondary | ICD-10-CM

## 2018-11-28 DIAGNOSIS — R202 Paresthesia of skin: Secondary | ICD-10-CM | POA: Diagnosis not present

## 2018-11-28 DIAGNOSIS — R200 Anesthesia of skin: Secondary | ICD-10-CM

## 2018-11-28 DIAGNOSIS — G8929 Other chronic pain: Secondary | ICD-10-CM

## 2018-11-28 NOTE — Progress Notes (Signed)
Virtual Visit via Video Note    This visit type was conducted due to national recommendations for restrictions regarding the COVID-19 Pandemic (e.g. social distancing) in an effort to limit this patient's exposure and mitigate transmission in our community.  This format is felt to be most appropriate for this patient at this time.  All issues noted in this document were discussed and addressed.  No physical exam was performed (except for noted visual exam findings with Video Visits).  Please refer to the patient's chart (MyChart message for video visits and phone note for telephone visits) for the patient's consent to telehealth for [facility].  Date:  12/17/2018   ID:  [name], DOB 14-Jun-1961, MRN [id number]  Patient Location:  Home - spoke with [name]  Provider location:   Office    Chief Complaint:  Right shoulder pain  History of Present Illness:    [name] is a 58 y.o. male who presents via video conferencing for a telehealth visit today.    The patient does not have symptoms concerning for COVID-19 infection (fever, chills, cough, or new shortness of breath).   [facility] due to having pain with lifiting his arm.  Had fluid between muscles and diagnosed with arthritis (diclofenac cream).  Has been using at night and every 4 hours.  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  He is having a disability claim from the [place].  When tries to tie bow tie catches cramp in right hand (dominant right) ambidextrous  Shoulder Pain   The pain is present in the right shoulder. This is a chronic problem. The current episode started more than 1 month ago. There has been no history of extremity trauma. The quality of the pain is described as aching. Pertinent negatives include no numbness. The symptoms are aggravated by activity and lying down. He has tried NSAIDS (diclofena cream) for the symptoms. The treatment provided mild relief. There is no  history of osteoarthritis or rheumatoid arthritis.  Back Pain  This is a chronic problem. The current episode started more than 1 month ago. The symptoms are aggravated by position. Associated symptoms include paresthesias. Pertinent negatives include no abdominal pain, numbness or paresis. Treatments tried: back brace.     Past Medical History:  Diagnosis Date  . Arthritis   . Complication of anesthesia    admit with post-op pneumonitis 04/21/11 (LMA had become dislodged during surgery earlier that day)  . Difficult intubation    ? difficult intubation per pt 04/21/11 ([facility]); however, notes indicate LMA disloged and unsuccesfful attempt to replace-->admitted with post-op pneumonitis  . GERD (gastroesophageal reflux disease)   . Hypertension    Past Surgical History:  Procedure Laterality Date  . CERVICAL DISC ARTHROPLASTY N/A 02/24/2015   Procedure: Cervical five-six Artificial disc replacement;  Surgeon: Barnett Abu, MD;  Location: [facility];  Service: Neurosurgery;  Laterality: N/A;  C5-6 Artificial disc replacement  . VASECTOMY    . WRIST SURGERY       Current Meds  Medication Sig  . amLODipine (NORVASC) 2.5 MG tablet Take 2.5 mg by mouth daily.  Marland Kitchen atorvastatin (LIPITOR) 40 MG tablet Take 40 mg by mouth daily.  . cyclobenzaprine (FLEXERIL) 10 MG tablet Take 10 mg by mouth at bedtime.  Marland Kitchen ibuprofen (ADVIL,MOTRIN) 600 MG tablet Take 1 tablet (600 mg total) by mouth every 8 (eight) hours as needed for mild pain or moderate pain.  Marland Kitchen  meloxicam (MOBIC) 15 MG tablet Take 15 mg by mouth daily.  . Multiple Vitamin (MULTIVITAMIN WITH MINERALS) TABS Take 1 tablet by mouth daily.     Allergies:   Patient has no known allergies.   Social History   Tobacco Use  . Smoking status: Never Smoker  . Smokeless tobacco: Former Engineer, water Use Topics  . Alcohol use: No  . Drug use: No     Family Hx: The patient's family history includes Cancer in his father; Diabetes in his  mother.  ROS:   Please see the history of present illness.    Review of Systems  Gastrointestinal: Negative for abdominal pain.  Musculoskeletal: Positive for back pain.  Neurological: Positive for paresthesias. Negative for numbness.    All other systems reviewed and are negative.   Labs/Other Tests and Data Reviewed:    Recent Labs: 08/14/2018: ALT 62; BUN 10; Creatinine, Ser 0.99; Hemoglobin 15.3; Platelets 228; Potassium 4.1; Sodium 140   Recent Lipid Panel Lab Results  Component Value Date/Time   CHOL 188 08/14/2018 10:28 AM   TRIG 85 08/14/2018 10:28 AM   HDL 39 (L) 08/14/2018 10:28 AM   CHOLHDL 4.8 08/14/2018 10:28 AM   CHOLHDL 3.3 01/09/2008 05:40 AM   LDLCALC 132 (H) 08/14/2018 10:28 AM    Wt Readings from Last 3 Encounters:  11/28/18 177 lb (80.3 kg)  08/14/18 182 lb (82.6 kg)  06/12/18 178 lb (80.7 kg)     Exam:    Vital Signs:  Wt 177 lb (80.3 kg)   BMI 30.01 kg/m     Physical Exam  Constitutional: He is oriented to person, place, and time and well-developed, well-nourished, and in no distress. No distress.  Pulmonary/Chest: Effort normal.  Musculoskeletal:        General: No edema.     Comments: Right shoulder with limited range of motion at about 60 degrees  Neurological: He is alert and oriented to person, place, and time.  Psychiatric: Mood, memory, affect and judgment normal.    ASSESSMENT & PLAN:     1. Chronic right shoulder pain  Range of motion is decreased and would like to have physical therapy to see if improves - Ambulatory referral to Physical Therapy  2. Chronic bilateral low back pain without sciatica  Now having worsening numbness and tingling to lower extremities  Has a previous history of pinched nerve but has been quite a while since having an MRI and has been seen at the [facility] - MR Lumbar Spine Wo Contrast; Future  3. Numbness and tingling of both lower extremities  Will check MRI of lumbar spine for further evaluation and  likely referral to neurosurgery at the patients requestt - MR Lumbar Spine Wo Contrast; Future   COVID-19 Education: The signs and symptoms of COVID-19 were discussed with the patient and how to seek care for testing (follow up with PCP or arrange E-visit).  The importance of social distancing was discussed today.  Patient Risk:   After full review of this patients clinical status, I feel that they are at least moderate risk at this time.  Time:   Today, I have spent 19 minutes/ seconds with the patient with telehealth technology discussing above diagnoses.     Medication Adjustments/Labs and Tests Ordered: Current medicines are reviewed at length with the patient today.  Concerns regarding medicines are outlined above.   Tests Ordered: Orders Placed This Encounter  Procedures  . MR Lumbar Spine Wo Contrast  . Ambulatory  referral to Physical Therapy    Medication Changes: No orders of the defined types were placed in this encounter.   Disposition:  Follow up prn  Signed, Arnette FeltsJanece Kosisochukwu Burningham, FNP

## 2018-12-17 ENCOUNTER — Encounter: Payer: Self-pay | Admitting: Nurse Practitioner

## 2018-12-19 ENCOUNTER — Telehealth: Payer: Self-pay

## 2018-12-19 ENCOUNTER — Other Ambulatory Visit: Payer: Self-pay | Admitting: Nurse Practitioner

## 2018-12-19 DIAGNOSIS — G8929 Other chronic pain: Secondary | ICD-10-CM

## 2018-12-19 DIAGNOSIS — R202 Paresthesia of skin: Secondary | ICD-10-CM

## 2018-12-19 DIAGNOSIS — M545 Low back pain, unspecified: Secondary | ICD-10-CM

## 2018-12-19 DIAGNOSIS — R200 Anesthesia of skin: Secondary | ICD-10-CM

## 2018-12-19 NOTE — Telephone Encounter (Signed)
I called pt and notified him that his has a bulging disc and if he would like to be referred to Neuro Surgery he stated yes he would like the referral. Adolph Pollack

## 2018-12-21 ENCOUNTER — Encounter: Payer: Self-pay | Admitting: Nurse Practitioner

## 2019-01-13 ENCOUNTER — Other Ambulatory Visit: Payer: Self-pay | Admitting: *Deleted

## 2019-01-13 DIAGNOSIS — Z20822 Contact with and (suspected) exposure to covid-19: Secondary | ICD-10-CM

## 2019-01-15 LAB — NOVEL CORONAVIRUS, NAA: SARS-CoV-2, NAA: NOT DETECTED

## 2019-01-31 ENCOUNTER — Other Ambulatory Visit: Payer: Self-pay | Admitting: Internal Medicine

## 2019-01-31 DIAGNOSIS — Z20822 Contact with and (suspected) exposure to covid-19: Secondary | ICD-10-CM

## 2019-02-04 LAB — NOVEL CORONAVIRUS, NAA: SARS-CoV-2, NAA: NOT DETECTED

## 2019-02-28 ENCOUNTER — Other Ambulatory Visit: Payer: Self-pay

## 2019-02-28 DIAGNOSIS — Z20822 Contact with and (suspected) exposure to covid-19: Secondary | ICD-10-CM

## 2019-03-04 LAB — NOVEL CORONAVIRUS, NAA: SARS-CoV-2, NAA: NOT DETECTED

## 2019-04-19 ENCOUNTER — Other Ambulatory Visit: Payer: Self-pay

## 2019-04-19 DIAGNOSIS — Z20822 Contact with and (suspected) exposure to covid-19: Secondary | ICD-10-CM

## 2019-04-20 LAB — NOVEL CORONAVIRUS, NAA: SARS-CoV-2, NAA: NOT DETECTED

## 2019-04-23 ENCOUNTER — Encounter: Payer: Self-pay | Admitting: Nurse Practitioner

## 2019-08-17 ENCOUNTER — Encounter: Admitting: Nurse Practitioner

## 2019-08-30 ENCOUNTER — Encounter: Admitting: Nurse Practitioner

## 2019-09-17 ENCOUNTER — Encounter: Admitting: Nurse Practitioner

## 2019-10-02 ENCOUNTER — Ambulatory Visit (INDEPENDENT_AMBULATORY_CARE_PROVIDER_SITE_OTHER): Admitting: Nurse Practitioner

## 2019-10-02 ENCOUNTER — Other Ambulatory Visit: Payer: Self-pay

## 2019-10-02 ENCOUNTER — Encounter: Payer: Self-pay | Admitting: Nurse Practitioner

## 2019-10-02 VITALS — BP 122/70 | HR 84 | Temp 98.3°F | Ht 64.6 in | Wt 179.6 lb

## 2019-10-02 DIAGNOSIS — Z0000 Encounter for general adult medical examination without abnormal findings: Secondary | ICD-10-CM

## 2019-10-02 DIAGNOSIS — E782 Mixed hyperlipidemia: Secondary | ICD-10-CM | POA: Diagnosis not present

## 2019-10-02 DIAGNOSIS — I10 Essential (primary) hypertension: Secondary | ICD-10-CM | POA: Diagnosis not present

## 2019-10-02 DIAGNOSIS — R7303 Prediabetes: Secondary | ICD-10-CM | POA: Diagnosis not present

## 2019-10-02 DIAGNOSIS — Z125 Encounter for screening for malignant neoplasm of prostate: Secondary | ICD-10-CM

## 2019-10-02 LAB — POCT URINALYSIS DIPSTICK
Bilirubin, UA: NEGATIVE
Blood, UA: NEGATIVE
Glucose, UA: NEGATIVE
Ketones, UA: NEGATIVE
Leukocytes, UA: NEGATIVE
Nitrite, UA: NEGATIVE
Protein, UA: NEGATIVE
Spec Grav, UA: 1.015 (ref 1.010–1.025)
Urobilinogen, UA: 0.2 E.U./dL
pH, UA: 5.5 (ref 5.0–8.0)

## 2019-10-02 LAB — POCT UA - MICROALBUMIN
Albumin/Creatinine Ratio, Urine, POC: 30
Creatinine, POC: 100 mg/dL
Microalbumin Ur, POC: 10 mg/L

## 2019-10-02 NOTE — Progress Notes (Addendum)
Subjective:     Patient ID: [name] , male    DOB: 29-Nov-1960 , 59 y.o.   MRN: [id number]   Chief Complaint  Patient presents with  . Annual Exam   Men's preventive visit. Patient Health Questionnaire (PHQ-2) is    Office Visit from 10/02/2019 in [facility]  PHQ-2 Total Score  0    Patient is on a mostly fish diet, no red meat. Marital status: Married. Relevant history for alcohol use is:  Social History   Substance and Sexual Activity  Alcohol Use No   Relevant history for tobacco use is:  Social History   Tobacco Use  Smoking Status Never Smoker  Smokeless Tobacco Former Neurosurgeon    HPI  Here for Health Maintenance  Hyperlidemia - taking atorvastatin daily.    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   Wt Readings from Last 3 Encounters: 10/02/19 : 179 lb 9.6 oz (81.5 kg) 11/28/18 : 177 lb (80.3 kg) 08/14/18 : 182 lb (82.6 kg)   Hypertension This is a chronic problem. The current episode started more than 1 year ago. The problem is unchanged. The problem is controlled. Pertinent negatives include no anxiety or malaise/fatigue. There are no associated agents to hypertension. There are no known risk factors for coronary artery disease. Past treatments include nothing. Compliance problems include exercise.  There is no history of angina. There is no history of chronic renal disease.    Men's preventive visit. Patient Health Questionnaire (PHQ-2) is    Office Visit from 10/02/2019 in [facility]  PHQ-2 Total Score  0     Patient is on a regular diet. Getting back into exercise good when the weather changed had more difficulty. Marital status: Married. Relevant history for alcohol use is:  Social History   Substance and Sexual Activity  Alcohol Use No   Relevant history for tobacco use is:  Social History   Tobacco Use  Smoking Status Never Smoker  Smokeless Tobacco Former Neurosurgeon     Past Medical History:  Diagnosis Date  . Arthritis   . Complication of anesthesia    admit with post-op pneumonitis 04/21/11 (LMA had become dislodged during surgery earlier that day)  . Difficult intubation    ? difficult intubation per pt 04/21/11 ([facility]); however, notes indicate LMA disloged and unsuccesfful attempt to replace-->admitted with post-op pneumonitis  . GERD (gastroesophageal reflux disease)   . Hypertension      Family History  Problem Relation Age of Onset  . Diabetes Mother   . Cancer Father      Current Outpatient Medications:  .  amLODipine (NORVASC) 2.5 MG tablet, Take 2.5 mg by mouth daily., Disp: , Rfl:  .  atorvastatin (LIPITOR) 40 MG tablet, Take 40 mg by mouth daily., Disp: , Rfl:  .  cyclobenzaprine (FLEXERIL) 10 MG tablet, Take 10 mg by mouth at bedtime., Disp: , Rfl:  .  ibuprofen (ADVIL,MOTRIN) 600 MG tablet, Take 1 tablet (600 mg total) by mouth every 8 (eight) hours as needed for mild pain or moderate pain., Disp: 15 tablet, Rfl: 0 .  Multiple Vitamin (MULTIVITAMIN WITH MINERALS) TABS, Take 1 tablet by mouth daily., Disp: , Rfl:    No Known Allergies   Review of Systems  Constitutional: Negative.  Negative for malaise/fatigue.  HENT: Negative.   Eyes: Negative.   Respiratory: Negative.   Cardiovascular: Negative.   Gastrointestinal: Negative.   Endocrine:  Negative.   Genitourinary: Negative.   Musculoskeletal: Negative.   Skin: Negative.   Allergic/Immunologic: Negative.   Neurological: Negative.   Hematological: Negative.   Psychiatric/Behavioral: Negative.      Today's Vitals   10/02/19 1451  BP: 122/70  Pulse: 84  Temp: 98.3 F (36.8 C)  Weight: 179 lb 9.6 oz (81.5 kg)  Height: 5' 4.6" (1.641 m)  PainSc: 0-No pain   Body mass index is 30.26 kg/m.   Objective:  Physical Exam Vitals reviewed.  Constitutional:      General: He is not in acute distress.    Appearance: Normal appearance.  HENT:     Head:  Normocephalic and atraumatic.     Right Ear: Tympanic membrane, ear canal and external ear normal.     Left Ear: Tympanic membrane, ear canal and external ear normal.     Nose: Nose normal. No congestion.     Mouth/Throat:     Mouth: Mucous membranes are moist.  Eyes:     Extraocular Movements: Extraocular movements intact.     Conjunctiva/sclera: Conjunctivae normal.     Pupils: Pupils are equal, round, and reactive to light.  Cardiovascular:     Rate and Rhythm: Normal rate and regular rhythm.     Pulses: Normal pulses.     Heart sounds: Normal heart sounds.  Pulmonary:     Effort: Pulmonary effort is normal.     Breath sounds: Normal breath sounds.  Abdominal:     General: Abdomen is flat. Bowel sounds are normal. There is no distension.     Palpations: Abdomen is soft.  Musculoskeletal:        General: Normal range of motion.     Cervical back: Normal range of motion and neck supple.  Skin:    General: Skin is warm and dry.     Capillary Refill: Capillary refill takes less than 2 seconds.  Neurological:     General: No focal deficit present.     Mental Status: He is alert and oriented to person, place, and time.  Psychiatric:        Mood and Affect: Mood normal.        Behavior: Behavior normal.        Thought Content: Thought content normal.        Judgment: Judgment normal.         Assessment And Plan:     1. Routine general medical examination at a health care facility . Behavior modifications discussed and diet history reviewed.   . Pt will continue to exercise regularly and modify diet with low GI, plant based foods and decrease intake of processed foods.  . Recommend intake of daily multivitamin, Vitamin D, and calcium.  . Recommend colonoscopy for preventive screenings, as well as recommend immunizations that include influenza  2. Essential hypertension . B/P is controlled.  . CMP ordered to check renal function.  . The importance of regular exercise and  dietary modification was stressed to the patient.  . EKG done with NSR HR 75 . microalbumin is normal - POCT Urinalysis Dipstick (81002) - POCT UA - Microalbumin - EKG 12-Lead  3. Mixed hyperlipidemia  Chronic, controlled  Continue with current medications  4. Prediabetes  Chronic, stable  Encouraged to limit intake of sugary foods and drinks  Encouraged to increase physical activity to 150 minutes per week  No current medications                  Kosha Jaquith  Christell Constant, FNP

## 2019-10-03 LAB — CMP14+EGFR
ALT: 26 IU/L (ref 0–44)
AST: 22 IU/L (ref 0–40)
Albumin/Globulin Ratio: 2.2 (ref 1.2–2.2)
Albumin: 4.6 g/dL (ref 3.8–4.9)
Alkaline Phosphatase: 91 IU/L (ref 39–117)
BUN/Creatinine Ratio: 10 (ref 9–20)
BUN: 9 mg/dL (ref 6–24)
Bilirubin Total: 0.7 mg/dL (ref 0.0–1.2)
CO2: 24 mmol/L (ref 20–29)
Calcium: 9.3 mg/dL (ref 8.7–10.2)
Chloride: 104 mmol/L (ref 96–106)
Creatinine, Ser: 0.89 mg/dL (ref 0.76–1.27)
GFR calc Af Amer: 110 mL/min/{1.73_m2} (ref >59)
GFR calc non Af Amer: 96 mL/min/{1.73_m2} (ref >59)
Globulin, Total: 2.1 g/dL (ref 1.5–4.5)
Glucose: 93 mg/dL (ref 65–99)
Potassium: 4.1 mmol/L (ref 3.5–5.2)
Sodium: 141 mmol/L (ref 134–144)
Total Protein: 6.7 g/dL (ref 6.0–8.5)

## 2019-10-03 LAB — CBC
Hematocrit: 42.2 % (ref 37.5–51.0)
Hemoglobin: 14.7 g/dL (ref 13.0–17.7)
MCH: 30.1 pg (ref 26.6–33.0)
MCHC: 34.8 g/dL (ref 31.5–35.7)
MCV: 87 fL (ref 79–97)
Platelets: 235 10*3/uL (ref 150–450)
RBC: 4.88 x10E6/uL (ref 4.14–5.80)
RDW: 13.1 % (ref 11.6–15.4)
WBC: 6.5 10*3/uL (ref 3.4–10.8)

## 2019-10-03 LAB — LIPID PANEL
Chol/HDL Ratio: 4.2 ratio (ref 0.0–5.0)
Cholesterol, Total: 150 mg/dL (ref 100–199)
HDL: 36 mg/dL — ABNORMAL LOW (ref >39)
LDL Chol Calc (NIH): 97 mg/dL (ref 0–99)
Triglycerides: 92 mg/dL (ref 0–149)
VLDL Cholesterol Cal: 17 mg/dL (ref 5–40)

## 2019-10-03 LAB — HEMOGLOBIN A1C
Est. average glucose Bld gHb Est-mCnc: 131 mg/dL
Hgb A1c MFr Bld: 6.2 % — ABNORMAL HIGH (ref 4.8–5.6)

## 2019-10-03 LAB — PSA: Prostate Specific Ag, Serum: 2.7 ng/mL (ref 0.0–4.0)

## 2020-01-29 ENCOUNTER — Ambulatory Visit (HOSPITAL_COMMUNITY): Admission: EM | Admit: 2020-01-29 | Discharge: 2020-01-29 | Disposition: A | Discharge status: Home / Self Care

## 2020-01-29 ENCOUNTER — Encounter (HOSPITAL_COMMUNITY): Payer: Self-pay

## 2020-01-29 ENCOUNTER — Other Ambulatory Visit: Payer: Self-pay

## 2020-01-29 DIAGNOSIS — R21 Rash and other nonspecific skin eruption: Secondary | ICD-10-CM

## 2020-01-29 DIAGNOSIS — W57XXXA Bitten or stung by nonvenomous insect and other nonvenomous arthropods, initial encounter: Secondary | ICD-10-CM

## 2020-01-29 NOTE — ED Provider Notes (Signed)
[facility]    CSN: [id number] Arrival date & time: 01/29/20  1806      History   Chief Complaint Chief Complaint  Patient presents with  . tick bite    HPI [name] is a 59 y.o. male.   Pt complains of a swollen area on his back.  Pt thinks he may have had a tick bite. Pt reports area has remained swollen   The history is provided by the patient. No language interpreter was used.    Past Medical History:  Diagnosis Date  . Arthritis   . Complication of anesthesia    admit with post-op pneumonitis 04/21/11 (LMA had become dislodged during surgery earlier that day)  . Difficult intubation    ? difficult intubation per pt 04/21/11 ([facility]); however, notes indicate LMA disloged and unsuccesfful attempt to replace-->admitted with post-op pneumonitis  . GERD (gastroesophageal reflux disease)   . Hypertension     Patient Active Problem List   Diagnosis Date Noted  . Essential hypertension 10/02/2019  . Mixed hyperlipidemia 10/02/2019  . Prediabetes 10/02/2019  . Chronic right shoulder pain 11/28/2018  . Cervical radiculopathy 02/24/2015    Past Surgical History:  Procedure Laterality Date  . CERVICAL DISC ARTHROPLASTY N/A 02/24/2015   Procedure: Cervical five-six Artificial disc replacement;  Surgeon: Barnett Abu, MD;  Location: [facility];  Service: Neurosurgery;  Laterality: N/A;  C5-6 Artificial disc replacement  . VASECTOMY    . WRIST SURGERY         Home Medications    Prior to Admission medications   Medication Sig Start Date End Date Taking? Authorizing Provider  amLODipine (NORVASC) 2.5 MG tablet Take 2.5 mg by mouth daily.   Yes [provider]  atorvastatin (LIPITOR) 40 MG tablet Take 40 mg by mouth daily.   Yes [provider]  Multiple Vitamin (MULTIVITAMIN WITH MINERALS) TABS Take 1 tablet by mouth daily.   Yes [provider]  cyclobenzaprine (FLEXERIL) 10 MG tablet Take 10 mg by mouth at bedtime.     [provider]  ibuprofen (ADVIL,MOTRIN) 600 MG tablet Take 1 tablet (600 mg total) by mouth every 8 (eight) hours as needed for mild pain or moderate pain. 06/27/16   Trixie Dredge, PA-C    Family History Family History  Problem Relation Age of Onset  . Diabetes Mother   . Cancer Father     Social History Social History   Tobacco Use  . Smoking status: Never Smoker  . Smokeless tobacco: Former Engineer, water Use Topics  . Alcohol use: No  . Drug use: No     Allergies   Patient has no known allergies.   Review of Systems Review of Systems  All other systems reviewed and are negative.    Physical Exam Triage Vital Signs ED Triage Vitals  Enc Vitals Group     BP 01/29/20 1832 133/89     Pulse Rate 01/29/20 1832 88     Resp 01/29/20 1832 16     Temp 01/29/20 1832 98.8 F (37.1 C)     Temp Source 01/29/20 1832 Oral     SpO2 01/29/20 1832 97 %     Weight --      Height --      Head Circumference --      Peak Flow --      Pain Score 01/29/20 1848 3     Pain Loc --      Pain Edu? --  Excl. in GC? --    No data found.  Updated Vital Signs BP 133/89 (BP Location: Left Arm)   Pulse 88   Temp 98.8 F (37.1 C) (Oral)   Resp 16   SpO2 97%   Visual Acuity Right Eye Distance:   Left Eye Distance:   Bilateral Distance:    Right Eye Near:   Left Eye Near:    Bilateral Near:     Physical Exam Vitals reviewed.  Cardiovascular:     Rate and Rhythm: Normal rate.  Pulmonary:     Effort: Pulmonary effort is normal.  Musculoskeletal:        General: Normal range of motion.  Skin:    Findings: Erythema present.     Comments: 2cm raised area, small dark center(scab)  No tick   Neurological:     General: No focal deficit present.  Psychiatric:        Mood and Affect: Mood normal.      UC Treatments / Results  Labs (all labs ordered are listed, but only abnormal results are displayed) Labs Reviewed - No data to  display  EKG   Radiology No results found.  Procedures Procedures (including critical care time)  Medications Ordered in UC Medications - No data to display  Initial Impression / Assessment and Plan / UC Course  I have reviewed the triage vital signs and the nursing notes.  Pertinent labs & imaging results that were available during my care of the patient were reviewed by me and considered in my medical decision making (see chart for details).     MDM:  Pt counseled on tick bite.  Pt advised to follow up with primary Final Clinical Impressions(s) / UC Diagnoses   Final diagnoses:  Insect bite, unspecified site, initial encounter     Discharge Instructions     Try using bacitracin ointment to the area   ED Prescriptions    None     PDMP not reviewed this encounter.  An After Visit Summary was printed and given to the patient.    Fransico Meadow, [place] 01/29/20 1906

## 2020-01-29 NOTE — Discharge Instructions (Signed)
Try using bacitracin ointment to the area

## 2020-01-29 NOTE — ED Triage Notes (Signed)
Pt c/o tick bite to lower back for approx 1 week. States it was swollen, itching, tender. Unsure if tick remains embedded. Also c/o increased fatigue, requiring more naps/daily.  Denies fever, chills, joint pain, n/v or other c/o.  Area of approx 19mm diameter noted to central/low back with open area of skin, black colored "scab" looking center. No insect noted at site.

## 2020-03-31 ENCOUNTER — Ambulatory Visit: Admitting: Nurse Practitioner

## 2020-10-02 ENCOUNTER — Encounter: Admitting: Nurse Practitioner

## 2022-04-11 ENCOUNTER — Other Ambulatory Visit: Payer: Self-pay

## 2022-04-11 ENCOUNTER — Encounter: Payer: Self-pay | Admitting: Emergency Medicine

## 2022-04-11 ENCOUNTER — Ambulatory Visit
Admission: EM | Admit: 2022-04-11 | Discharge: 2022-04-11 | Disposition: A | Discharge status: Home / Self Care | Attending: Physician Assistant | Admitting: Physician Assistant

## 2022-04-11 DIAGNOSIS — U071 COVID-19: Secondary | ICD-10-CM | POA: Diagnosis present

## 2022-04-11 NOTE — ED Provider Notes (Signed)
[facility]    CSN: [id number] Arrival date & time: 04/11/22  1323      History   Chief Complaint Chief Complaint  Patient presents with   Fever    HPI [name] is a 61 y.o. male.   Patient here today for evaluation of cough and fever.  He reports that he took an at home COVID test this morning and was positive.  He denies any sore throat.  He has had some nausea and vomiting but this was a week ago and has not had any since.  He has been taking over-the-counter medication which has helped somewhat with fever.  Patient requesting repeat COVID screening.  The history is provided by the patient.  Fever Associated symptoms: congestion and cough   Associated symptoms: no chills, no diarrhea, no ear pain, no nausea, no sore throat and no vomiting     Past Medical History:  Diagnosis Date   Arthritis    Complication of anesthesia    admit with post-op pneumonitis 04/21/11 (LMA had become dislodged during surgery earlier that day)   Difficult intubation    ? difficult intubation per pt 04/21/11 ([facility]); however, notes indicate LMA disloged and unsuccesfful attempt to replace-->admitted with post-op pneumonitis   GERD (gastroesophageal reflux disease)    Hypertension     Patient Active Problem List   Diagnosis Date Noted   Essential hypertension 10/02/2019   Mixed hyperlipidemia 10/02/2019   Prediabetes 10/02/2019   Chronic right shoulder pain 11/28/2018   Cervical radiculopathy 02/24/2015    Past Surgical History:  Procedure Laterality Date   CERVICAL DISC ARTHROPLASTY N/A 02/24/2015   Procedure: Cervical five-six Artificial disc replacement;  Surgeon: Barnett Abu, MD;  Location: [facility];  Service: Neurosurgery;  Laterality: N/A;  C5-6 Artificial disc replacement   VASECTOMY     WRIST SURGERY         Home Medications    Prior to Admission medications   Medication Sig Start Date End Date Taking? Authorizing Provider  amLODipine  (NORVASC) 2.5 MG tablet Take 2.5 mg by mouth daily.    [provider]  atorvastatin (LIPITOR) 40 MG tablet Take 40 mg by mouth daily.    [provider]  cyclobenzaprine (FLEXERIL) 10 MG tablet Take 10 mg by mouth at bedtime.    [provider]  ibuprofen (ADVIL,MOTRIN) 600 MG tablet Take 1 tablet (600 mg total) by mouth every 8 (eight) hours as needed for mild pain or moderate pain. 06/27/16   Trixie Dredge, PA-C  Multiple Vitamin (MULTIVITAMIN WITH MINERALS) TABS Take 1 tablet by mouth daily.    [provider]    Family History Family History  Problem Relation Age of Onset   Diabetes Mother    Cancer Father     Social History Social History   Tobacco Use   Smoking status: Never   Smokeless tobacco: Former  Substance Use Topics   Alcohol use: No   Drug use: No     Allergies   Patient has no known allergies.   Review of Systems Review of Systems  Constitutional:  Positive for fever. Negative for chills.  HENT:  Positive for congestion. Negative for ear pain and sore throat.   Eyes:  Negative for discharge and redness.  Respiratory:  Positive for cough. Negative for shortness of breath.   Gastrointestinal:  Negative for abdominal pain, diarrhea, nausea and vomiting.     Physical Exam Triage Vital Signs ED Triage Vitals  Enc Vitals Group     BP 04/11/22 1336 (!) 144/91     Pulse Rate 04/11/22 1336 96     Resp 04/11/22 1336 18     Temp 04/11/22 1336 99 F (37.2 C)     Temp Source 04/11/22 1336 Oral     SpO2 04/11/22 1336 96 %     Weight --      Height --      Head Circumference --      Peak Flow --      Pain Score 04/11/22 1337 2     Pain Loc --      Pain Edu? --      Excl. in GC? --    No data found.  Updated Vital Signs BP (!) 144/91 (BP Location: Left Arm)   Pulse 96   Temp 99 F (37.2 C) (Oral)   Resp 18   SpO2 96%       Physical Exam Vitals and nursing note reviewed.  Constitutional:      General: He is  not in acute distress.    Appearance: Normal appearance. He is not ill-appearing.  HENT:     Head: Normocephalic and atraumatic.     Nose: Nose normal. No congestion.     Mouth/Throat:     Mouth: Mucous membranes are moist.     Pharynx: Oropharynx is clear. Posterior oropharyngeal erythema present. No oropharyngeal exudate.  Eyes:     Conjunctiva/sclera: Conjunctivae normal.  Cardiovascular:     Rate and Rhythm: Normal rate and regular rhythm.     Heart sounds: Normal heart sounds. No murmur heard. Pulmonary:     Effort: Pulmonary effort is normal. No respiratory distress.     Breath sounds: Normal breath sounds. No wheezing, rhonchi or rales.  Skin:    General: Skin is warm and dry.  Neurological:     Mental Status: He is alert.  Psychiatric:        Mood and Affect: Mood normal.        Thought Content: Thought content normal.      UC Treatments / Results  Labs (all labs ordered are listed, but only abnormal results are displayed) Labs Reviewed  SARS CORONAVIRUS 2 (TAT 6-24 HRS)    EKG   Radiology No results found.  Procedures Procedures (including critical care time)  Medications Ordered in UC Medications - No data to display  Initial Impression / Assessment and Plan / UC Course  I have reviewed the triage vital signs and the nursing notes.  Pertinent labs & imaging results that were available during my care of the patient were reviewed by me and considered in my medical decision making (see chart for details).   COVID screening ordered.  Patient questions Paxlovid, however does not have significant risk factors so I did recommend he discuss same with his PCP.  Patient is agreeable to same.  Encourage continued symptomatic treatment as needed.  Recommend follow-up with any further concerns or worsening symptoms.   Final Clinical Impressions(s) / UC Diagnoses   Final diagnoses:  COVID-19   Discharge Instructions   None    ED Prescriptions   None     PDMP not reviewed this encounter.   Tomi Bamberger, PA-C 04/11/22 1404

## 2022-04-11 NOTE — ED Triage Notes (Signed)
Pt here after testing positive for covid today at home; pt sts fever and cough with some vomiting last Monday evening

## 2022-04-12 LAB — SARS CORONAVIRUS 2 (TAT 6-24 HRS): SARS Coronavirus 2: POSITIVE — AB
